# Patient Record
Sex: Male | Born: 1965 | Race: Black or African American | Hispanic: No | Marital: Single | Smoking: Current every day smoker
Health system: Southern US, Community
[De-identification: ages and names within clinical notes are randomized; demographics above are authoritative.]

---

## 2007-07-03 ENCOUNTER — Emergency Department (HOSPITAL_COMMUNITY): Admission: EM | Admit: 2007-07-03 | Discharge: 2007-07-03 | Payer: Self-pay | Admitting: Emergency Medicine

## 2011-03-22 ENCOUNTER — Emergency Department (HOSPITAL_COMMUNITY)
Admission: EM | Admit: 2011-03-22 | Discharge: 2011-03-24 | Disposition: A | Discharge status: Other Acute Inpatient Hospital | Payer: Self-pay | Attending: Emergency Medicine | Admitting: Emergency Medicine

## 2011-03-22 DIAGNOSIS — F17200 Nicotine dependence, unspecified, uncomplicated: Secondary | ICD-10-CM | POA: Insufficient documentation

## 2011-03-22 DIAGNOSIS — H5316 Psychophysical visual disturbances: Secondary | ICD-10-CM | POA: Insufficient documentation

## 2011-03-22 DIAGNOSIS — F1910 Other psychoactive substance abuse, uncomplicated: Secondary | ICD-10-CM

## 2011-03-22 DIAGNOSIS — F329 Major depressive disorder, single episode, unspecified: Secondary | ICD-10-CM

## 2011-03-22 DIAGNOSIS — F3289 Other specified depressive episodes: Secondary | ICD-10-CM | POA: Insufficient documentation

## 2011-03-22 DIAGNOSIS — F1410 Cocaine abuse, uncomplicated: Secondary | ICD-10-CM | POA: Insufficient documentation

## 2011-03-22 DIAGNOSIS — R45850 Homicidal ideations: Secondary | ICD-10-CM | POA: Insufficient documentation

## 2011-03-22 DIAGNOSIS — R443 Hallucinations, unspecified: Secondary | ICD-10-CM

## 2011-03-22 DIAGNOSIS — IMO0002 Reserved for concepts with insufficient information to code with codable children: Secondary | ICD-10-CM | POA: Insufficient documentation

## 2011-03-22 DIAGNOSIS — F22 Delusional disorders: Secondary | ICD-10-CM | POA: Insufficient documentation

## 2011-03-22 LAB — CBC
HCT: 40.1 % (ref 39.0–52.0)
Hemoglobin: 13.8 g/dL (ref 13.0–17.0)
MCH: 31.4 pg (ref 26.0–34.0)
MCV: 91.3 fL (ref 78.0–100.0)
Platelets: 305 10*3/uL (ref 150–400)
RBC: 4.39 MIL/uL (ref 4.22–5.81)
WBC: 7.1 10*3/uL (ref 4.0–10.5)

## 2011-03-22 LAB — RAPID URINE DRUG SCREEN, HOSP PERFORMED
Amphetamines: NOT DETECTED
Barbiturates: NOT DETECTED
Benzodiazepines: NOT DETECTED
Tetrahydrocannabinol: NOT DETECTED

## 2011-03-22 LAB — BASIC METABOLIC PANEL
BUN: 8 mg/dL (ref 6–23)
CO2: 23 mEq/L (ref 19–32)
Calcium: 9.5 mg/dL (ref 8.4–10.5)
Chloride: 103 mEq/L (ref 96–112)
Creatinine, Ser: 0.99 mg/dL (ref 0.50–1.35)
Glucose, Bld: 95 mg/dL (ref 70–99)

## 2011-03-22 MED ORDER — LORAZEPAM 1 MG PO TABS
1.0000 mg | ORAL_TABLET | Freq: Three times a day (TID) | ORAL | Status: DC | PRN
  Administered 2011-03-23: 1 mg via ORAL
  Filled 2011-03-22: qty 1

## 2011-03-22 MED ORDER — NICOTINE 21 MG/24HR TD PT24: 21.0000 mg | MEDICATED_PATCH | Freq: Every day | TRANSDERMAL | Status: DC

## 2011-03-22 MED ORDER — ONDANSETRON HCL 4 MG PO TABS: 4.0000 mg | ORAL_TABLET | Freq: Three times a day (TID) | ORAL | Status: DC | PRN

## 2011-03-22 MED ORDER — LORAZEPAM 2 MG/ML IJ SOLN
2.0000 mg | Freq: Once | INTRAMUSCULAR | Status: AC
  Administered 2011-03-22: 2 mg via INTRAMUSCULAR
  Filled 2011-03-22: qty 1

## 2011-03-22 MED ORDER — ACETAMINOPHEN 325 MG PO TABS: 650.0000 mg | ORAL_TABLET | ORAL | Status: DC | PRN

## 2011-03-22 MED ORDER — IBUPROFEN 400 MG PO TABS: 600.0000 mg | ORAL_TABLET | Freq: Three times a day (TID) | ORAL | Status: DC | PRN

## 2011-03-22 MED ORDER — ZOLPIDEM TARTRATE 5 MG PO TABS: 5.0000 mg | ORAL_TABLET | Freq: Every evening | ORAL | Status: DC | PRN

## 2011-03-22 NOTE — ED Notes (Signed)
wanded by security before changing clothing, and after, belongings bagged and locked up, security at bedside for safety.  Pt was in exam 3 but moved to exam 16, sitter at bedside
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
## 2011-03-22 NOTE — ED Notes (Signed)
Dr.linker to see pt, pt cont. To yell out "Im gonna kill somebody", "i just cant help it".  "fucking niggers im gonna fuck them up"

## 2011-03-22 NOTE — ED Notes (Signed)
Pt's daughter committed suicide august 2012, walked to er tonight --seeing bugs,  Hearing voices that are telling him to kill people, pt yelling and cursing at unknown items in room

## 2011-03-23 NOTE — ED Notes (Signed)
Resting with eyes closed with sitter at bedside.  Respirations even, non-labored. In no apparent distress.

## 2011-03-23 NOTE — ED Notes (Signed)
Pt reports feeling homicidal and feelings of paranoia-states, "I just feel lost; I just want to kill everybody, and I feel like people are behind me; I see things crawling on the wall".  Pt reports that this has been happening since his daughter died in 13-Nov-2010.  Pt reports drinking everyday "a lot", and states that he last used crack cocaine yesterday.  Reports that his use of alcohol and drugs is more frequent since his daughter died. Cooperative, anxious.
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
## 2011-03-23 NOTE — ED Notes (Signed)
Erik Burns, ACT member at bedside to evaluate.

## 2011-03-23 NOTE — ED Notes (Signed)
Awake. Requested something to drink and crackers - given.  Watching TV. No complaints.

## 2011-03-24 NOTE — ED Notes (Signed)
See downtime charting. 
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
## 2011-03-24 NOTE — ED Notes (Signed)
Pt ate breakfast and requested to use the phone.  Allowed pt to use the phone.  Sitter at bedside.

## 2011-03-24 NOTE — ED Notes (Signed)
Erik Burns with act team here to see pt.

## 2011-03-24 NOTE — ED Notes (Addendum)
[employer] called for transportation [place], [employer] has been called and awaiting for transport to facility

## 2011-03-24 NOTE — ED Notes (Signed)
[employer] here for pt transportation

## 2011-04-08 ENCOUNTER — Emergency Department (HOSPITAL_COMMUNITY)
Admission: EM | Admit: 2011-04-08 | Discharge: 2011-04-08 | Disposition: A | Discharge status: Home / Self Care | Payer: Self-pay | Attending: Emergency Medicine | Admitting: Emergency Medicine

## 2011-04-08 ENCOUNTER — Encounter (HOSPITAL_COMMUNITY): Payer: Self-pay | Admitting: Emergency Medicine

## 2011-04-08 DIAGNOSIS — Z79899 Other long term (current) drug therapy: Secondary | ICD-10-CM | POA: Insufficient documentation

## 2011-04-08 DIAGNOSIS — F3289 Other specified depressive episodes: Secondary | ICD-10-CM | POA: Insufficient documentation

## 2011-04-08 DIAGNOSIS — F1910 Other psychoactive substance abuse, uncomplicated: Secondary | ICD-10-CM | POA: Insufficient documentation

## 2011-04-08 DIAGNOSIS — F17200 Nicotine dependence, unspecified, uncomplicated: Secondary | ICD-10-CM | POA: Insufficient documentation

## 2011-04-08 DIAGNOSIS — F329 Major depressive disorder, single episode, unspecified: Secondary | ICD-10-CM | POA: Insufficient documentation

## 2011-04-08 NOTE — ED Notes (Signed)
ACT team with patient 

## 2011-04-08 NOTE — ED Notes (Signed)
MD at bedside. 
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
## 2011-04-08 NOTE — ED Notes (Addendum)
This clinician talked briefly with Erik Burns.  He has been given referral information on rehab beds.  This clinician did call both [facility] in [place] and [facility] and both places said that a patient would need to call during business hours to apply for a rehabilitation bed.  Denies any SI, HI or A/V hallucinations.  Mr. Tallerico said that he had no way to get home in [place].  Dr. Loretha Stapler was informed and he will place a call to SW.
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
## 2011-04-09 NOTE — ED Provider Notes (Signed)
I saw and evaluated the patient, reviewed the resident's note and I agree with the findings and plan. Pt is well appearing and was dropped off here due to being kicked out of rehab.  He has not been abuse any substances and denies needing anything except a ride back home.  Gwyneth Sprout, MD 04/09/11 1124

## 2011-07-31 ENCOUNTER — Encounter (HOSPITAL_COMMUNITY): Payer: Self-pay | Admitting: *Deleted

## 2011-07-31 ENCOUNTER — Emergency Department (HOSPITAL_COMMUNITY): Payer: No Typology Code available for payment source

## 2011-07-31 ENCOUNTER — Emergency Department (HOSPITAL_COMMUNITY)
Admission: EM | Admit: 2011-07-31 | Discharge: 2011-07-31 | Disposition: A | Discharge status: Home / Self Care | Payer: No Typology Code available for payment source | Attending: Emergency Medicine | Admitting: Emergency Medicine

## 2011-07-31 DIAGNOSIS — M25559 Pain in unspecified hip: Secondary | ICD-10-CM | POA: Insufficient documentation

## 2011-07-31 DIAGNOSIS — S8000XA Contusion of unspecified knee, initial encounter: Secondary | ICD-10-CM | POA: Insufficient documentation

## 2011-07-31 DIAGNOSIS — S7000XA Contusion of unspecified hip, initial encounter: Secondary | ICD-10-CM | POA: Insufficient documentation

## 2011-07-31 DIAGNOSIS — M25569 Pain in unspecified knee: Secondary | ICD-10-CM | POA: Insufficient documentation

## 2011-07-31 DIAGNOSIS — T07XXXA Unspecified multiple injuries, initial encounter: Secondary | ICD-10-CM

## 2011-07-31 DIAGNOSIS — Y92410 Unspecified street and highway as the place of occurrence of the external cause: Secondary | ICD-10-CM | POA: Insufficient documentation

## 2011-07-31 MED ORDER — IBUPROFEN 800 MG PO TABS: 800.0000 mg | ORAL_TABLET | Freq: Three times a day (TID) | ORAL | Status: AC

## 2011-07-31 MED ORDER — IBUPROFEN 800 MG PO TABS
800.0000 mg | ORAL_TABLET | Freq: Once | ORAL | Status: AC
  Administered 2011-07-31: 800 mg via ORAL
  Filled 2011-07-31: qty 1
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
## 2011-07-31 NOTE — Discharge Instructions (Signed)
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
## 2011-07-31 NOTE — ED Notes (Signed)
Abrasion to left knee noted

## 2011-08-04 ENCOUNTER — Encounter (HOSPITAL_COMMUNITY): Payer: Self-pay | Admitting: *Deleted

## 2011-08-04 ENCOUNTER — Emergency Department (HOSPITAL_COMMUNITY)
Admission: EM | Admit: 2011-08-04 | Discharge: 2011-08-04 | Disposition: A | Discharge status: Home / Self Care | Payer: No Typology Code available for payment source | Attending: Emergency Medicine | Admitting: Emergency Medicine

## 2011-08-04 DIAGNOSIS — M25559 Pain in unspecified hip: Secondary | ICD-10-CM

## 2011-08-04 NOTE — Discharge Instructions (Signed)
Please soak hip in warm salt water 2 times daily. Alternate 1000mg  of tylenol and 800mg  of ibuprofen every 6 hours for soreness. Please set up appointment with Dr Hilda Lias for orthopedic opinion of your continued hip pain.Arthralgia Arthralgia is joint pain. A joint is a place where two bones meet. Joint pain can happen for many reasons. The joint can be bruised, stiff, infected, or weak from aging. Pain usually goes away after resting and taking medicine for soreness.  HOME CARE  Rest the joint as told by your doctor.   Keep the sore joint raised (elevated) for the first 24 hours.   Put ice on the joint area.   Put ice in a plastic bag.   Place a towel between your skin and the bag.   Leave the ice on for 15 to 20 minutes, 3 to 4 times a day.   Wear your splint, casting, elastic bandage, or sling as told by your doctor.   Only take medicine as told by your doctor. Do not take aspirin.   Use crutches as told by your doctor. Do not put weight on the joint until told to by your doctor.  GET HELP RIGHT AWAY IF:   You have bruising, puffiness (swelling), or more pain.   Your fingers or toes turn blue or start to lose feeling (numb).   Your medicine does not lessen the pain.   Your pain becomes severe.   You have a temperature by mouth above 102 F (38.9 C), not controlled by medicine.   You cannot move or use the joint.  MAKE SURE YOU:   Understand these instructions.   Will watch your condition.   Will get help right away if you are not doing well or get worse.  Document Released: 02/17/2009 Document Revised: 02/18/2011 Document Reviewed: 02/17/2009 [facility] Patient Information 2012 [place], [place].

## 2011-08-04 NOTE — ED Provider Notes (Signed)
History     CSN: [id number]  Arrival date & time 08/04/11  1610   First MD Initiated Contact with Patient 08/04/11 1635      Chief Complaint  Patient presents with  . Hip Pain    (Consider location/radiation/quality/duration/timing/severity/associated sxs/prior treatment) Patient is a 46 y.o. male presenting with hip pain.  Hip Pain This is a new problem. The current episode started in the past 7 days. The problem occurs daily. The problem has been gradually worsening. Associated symptoms include arthralgias. Pertinent negatives include no abdominal pain, change in bowel habit, chest pain, coughing, joint swelling, nausea or neck pain. The symptoms are aggravated by bending, standing and walking. He has tried NSAIDs for the symptoms.    History reviewed. No pertinent past medical history.  No past surgical history on file.  No family history on file.  History  Substance Use Topics  . Smoking status: Current Everyday Smoker    Types: Cigarettes  . Smokeless tobacco: Not on file  . Alcohol Use: Yes     everyday      Review of Systems  Constitutional: Negative for activity change.       All ROS Neg except as noted in HPI  HENT: Negative for nosebleeds and neck pain.   Eyes: Negative for photophobia and discharge.  Respiratory: Negative for cough, shortness of breath and wheezing.   Cardiovascular: Negative for chest pain and palpitations.  Gastrointestinal: Negative for nausea, abdominal pain, blood in stool and change in bowel habit.  Genitourinary: Negative for dysuria, frequency and hematuria.  Musculoskeletal: Positive for arthralgias. Negative for back pain and joint swelling.  Skin: Negative.   Neurological: Negative for dizziness, seizures and speech difficulty.  Psychiatric/Behavioral: Negative for hallucinations and confusion.    Allergies  Review of patient's allergies indicates no known allergies.  Home Medications   Current Outpatient Rx  Name Route  Sig Dispense Refill  . IBUPROFEN 800 MG PO TABS Oral Take 1 tablet (800 mg total) by mouth 3 (three) times daily. 21 tablet 0  . NAPROXEN SODIUM 220 MG PO CAPS Oral Take 440 mg by mouth daily as needed. For pain (hip)      BP 117/70  Pulse 97  Temp(Src) 98 F (36.7 C) (Oral)  Resp 20  Ht 5' 6.5" (1.689 m)  Wt 130 lb (58.968 kg)  BMI 20.67 kg/m2  SpO2 100%  Physical Exam  Nursing note and vitals reviewed. Constitutional: He is oriented to person, place, and time. He appears well-developed and well-nourished.  Non-toxic appearance.  HENT:  Head: Normocephalic.  Right Ear: Tympanic membrane and external ear normal.  Left Ear: Tympanic membrane and external ear normal.  Eyes: EOM and lids are normal. Pupils are equal, round, and reactive to light.  Neck: Normal range of motion. Neck supple. Carotid bruit is not present.  Cardiovascular: Normal rate, regular rhythm, normal heart sounds, intact distal pulses and normal pulses.   Pulmonary/Chest: Breath sounds normal. No respiratory distress.  Abdominal: Soft. Bowel sounds are normal. There is no tenderness. There is no guarding.  Musculoskeletal: Normal range of motion.       Soreness with ROM of the left hip. No hot joint. No effusion noted. No deformity. Pt ambulatory, but with soreness.  Lymphadenopathy:       Head (right side): No submandibular adenopathy present.       Head (left side): No submandibular adenopathy present.    He has no cervical adenopathy.  Neurological: He is alert and oriented  to person, place, and time. He has normal strength. No cranial nerve deficit or sensory deficit.  Skin: Skin is warm and dry.  Psychiatric: He has a normal mood and affect. His speech is normal.    ED Course  Procedures (including critical care time)  Labs Reviewed - No data to display No results found.   No diagnosis found.    MDM  I have reviewed nursing notes, vital signs, and all appropriate lab and imaging results for  this patient. No gross neuro changes . No deformity. Pt willl see PCP for follow up and recheck.       Kathie Dike, [place] 08/11/11 2232
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
## 2011-08-12 NOTE — ED Provider Notes (Signed)
Medical screening examination/treatment/procedure(s) were performed by non-physician practitioner and as supervising physician I was immediately available for consultation/collaboration.   Glynn Octave, MD 08/12/11 [phone]

## 2011-09-26 ENCOUNTER — Emergency Department (HOSPITAL_COMMUNITY): Payer: Self-pay

## 2011-09-26 ENCOUNTER — Encounter (HOSPITAL_COMMUNITY): Payer: Self-pay

## 2011-09-26 ENCOUNTER — Emergency Department (HOSPITAL_COMMUNITY)
Admission: EM | Admit: 2011-09-26 | Discharge: 2011-09-26 | Disposition: A | Discharge status: Home / Self Care | Payer: Self-pay | Attending: Emergency Medicine | Admitting: Emergency Medicine

## 2011-09-26 DIAGNOSIS — M542 Cervicalgia: Secondary | ICD-10-CM | POA: Insufficient documentation

## 2011-09-26 DIAGNOSIS — S0990XA Unspecified injury of head, initial encounter: Secondary | ICD-10-CM | POA: Insufficient documentation

## 2011-09-26 DIAGNOSIS — Y9289 Other specified places as the place of occurrence of the external cause: Secondary | ICD-10-CM | POA: Insufficient documentation

## 2011-09-26 DIAGNOSIS — F17200 Nicotine dependence, unspecified, uncomplicated: Secondary | ICD-10-CM | POA: Insufficient documentation

## 2011-09-26 DIAGNOSIS — W19XXXA Unspecified fall, initial encounter: Secondary | ICD-10-CM | POA: Insufficient documentation

## 2011-09-26 MED ORDER — HYDROCODONE-ACETAMINOPHEN 5-325 MG PO TABS: 1.0000 | ORAL_TABLET | Freq: Four times a day (QID) | ORAL | Status: AC | PRN

## 2011-09-26 MED ORDER — HYDROCODONE-ACETAMINOPHEN 5-325 MG PO TABS
1.0000 | ORAL_TABLET | Freq: Once | ORAL | Status: AC
  Administered 2011-09-26: 1 via ORAL
  Filled 2011-09-26: qty 1
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
## 2011-10-17 ENCOUNTER — Encounter (HOSPITAL_COMMUNITY): Payer: Self-pay | Admitting: Emergency Medicine

## 2011-10-17 ENCOUNTER — Emergency Department (HOSPITAL_COMMUNITY): Payer: Self-pay

## 2011-10-17 ENCOUNTER — Emergency Department (HOSPITAL_COMMUNITY)
Admission: EM | Admit: 2011-10-17 | Discharge: 2011-10-17 | Disposition: A | Discharge status: Home / Self Care | Payer: Self-pay | Attending: Emergency Medicine | Admitting: Emergency Medicine

## 2011-10-17 DIAGNOSIS — S5292XA Unspecified fracture of left forearm, initial encounter for closed fracture: Secondary | ICD-10-CM

## 2011-10-17 DIAGNOSIS — S52509A Unspecified fracture of the lower end of unspecified radius, initial encounter for closed fracture: Secondary | ICD-10-CM | POA: Insufficient documentation

## 2011-10-17 DIAGNOSIS — W03XXXA Other fall on same level due to collision with another person, initial encounter: Secondary | ICD-10-CM | POA: Insufficient documentation

## 2011-10-17 DIAGNOSIS — S52613A Displaced fracture of unspecified ulna styloid process, initial encounter for closed fracture: Secondary | ICD-10-CM

## 2011-10-17 MED ORDER — HYDROCODONE-ACETAMINOPHEN 5-325 MG PO TABS
1.0000 | ORAL_TABLET | Freq: Once | ORAL | Status: AC
  Administered 2011-10-17: 1 via ORAL
  Filled 2011-10-17: qty 1

## 2011-10-17 MED ORDER — HYDROCODONE-ACETAMINOPHEN 5-325 MG PO TABS: 1.0000 | ORAL_TABLET | ORAL | Status: AC | PRN

## 2011-10-17 NOTE — ED Provider Notes (Signed)
Medical screening examination/treatment/procedure(s) were performed by non-physician practitioner and as supervising physician I was immediately available for consultation/collaboration.   Lyanne Co, MD 10/17/11 [phone]
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
## 2011-10-17 NOTE — ED Notes (Signed)
Pt has swelling to his left wrist x 2 days. Pt able to wiggle fingers but causes pain. Strong radial pulse palpated. Alert and oriented x 3. Skin warm and dry. Color pink.

## 2013-02-26 ENCOUNTER — Emergency Department (HOSPITAL_COMMUNITY)
Admission: EM | Admit: 2013-02-26 | Discharge: 2013-02-26 | Discharge status: Against Medical Advice | Payer: Self-pay | Attending: Emergency Medicine | Admitting: Emergency Medicine

## 2013-02-26 ENCOUNTER — Encounter (HOSPITAL_COMMUNITY): Payer: Self-pay | Admitting: Emergency Medicine

## 2013-02-26 DIAGNOSIS — Z046 Encounter for general psychiatric examination, requested by authority: Secondary | ICD-10-CM | POA: Insufficient documentation

## 2013-02-26 DIAGNOSIS — F17200 Nicotine dependence, unspecified, uncomplicated: Secondary | ICD-10-CM | POA: Insufficient documentation

## 2013-02-26 LAB — BASIC METABOLIC PANEL
BUN: 10 mg/dL (ref 6–23)
Calcium: 9.8 mg/dL (ref 8.4–10.5)
GFR calc non Af Amer: 80 mL/min — ABNORMAL LOW (ref >90)
Glucose, Bld: 93 mg/dL (ref 70–99)
Sodium: 138 mEq/L (ref 135–145)

## 2013-02-26 LAB — CBC
HCT: 50.9 % (ref 39.0–52.0)
Hemoglobin: 17.3 g/dL — ABNORMAL HIGH (ref 13.0–17.0)
MCH: 33.2 pg (ref 26.0–34.0)
MCHC: 34 g/dL (ref 30.0–36.0)

## 2013-02-26 NOTE — ED Notes (Signed)
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

## 2013-02-26 NOTE — ED Notes (Signed)
Denies hallucinations.

## 2013-02-27 ENCOUNTER — Emergency Department (HOSPITAL_COMMUNITY)
Admission: EM | Admit: 2013-02-27 | Discharge: 2013-02-27 | Disposition: A | Discharge status: Home / Self Care | Payer: Self-pay | Attending: Emergency Medicine | Admitting: Emergency Medicine

## 2013-02-27 ENCOUNTER — Encounter (HOSPITAL_COMMUNITY): Payer: Self-pay | Admitting: Emergency Medicine

## 2013-02-27 DIAGNOSIS — F1420 Cocaine dependence, uncomplicated: Secondary | ICD-10-CM | POA: Insufficient documentation

## 2013-02-27 DIAGNOSIS — F17200 Nicotine dependence, unspecified, uncomplicated: Secondary | ICD-10-CM | POA: Insufficient documentation

## 2013-02-27 DIAGNOSIS — F1020 Alcohol dependence, uncomplicated: Secondary | ICD-10-CM | POA: Insufficient documentation

## 2013-02-27 DIAGNOSIS — F1910 Other psychoactive substance abuse, uncomplicated: Secondary | ICD-10-CM

## 2013-02-27 DIAGNOSIS — F1220 Cannabis dependence, uncomplicated: Secondary | ICD-10-CM | POA: Insufficient documentation

## 2013-02-27 LAB — CBC
HCT: 45.8 % (ref 39.0–52.0)
MCHC: 34.9 g/dL (ref 30.0–36.0)
Platelets: 274 10*3/uL (ref 150–400)
RBC: 4.74 MIL/uL (ref 4.22–5.81)
RDW: 14.6 % (ref 11.5–15.5)
WBC: 4.8 10*3/uL (ref 4.0–10.5)

## 2013-02-27 LAB — ETHANOL: Alcohol, Ethyl (B): 109 mg/dL — ABNORMAL HIGH (ref 0–11)

## 2013-02-27 LAB — HEPATIC FUNCTION PANEL
Albumin: 3.6 g/dL (ref 3.5–5.2)
Total Protein: 7.4 g/dL (ref 6.0–8.3)

## 2013-02-27 LAB — SALICYLATE LEVEL: Salicylate Lvl: <2 mg/dL — ABNORMAL LOW (ref 2.8–20.0)

## 2013-02-27 LAB — URINALYSIS, ROUTINE W REFLEX MICROSCOPIC
Glucose, UA: NEGATIVE mg/dL
Hgb urine dipstick: NEGATIVE
Ketones, ur: NEGATIVE mg/dL
Protein, ur: NEGATIVE mg/dL

## 2013-02-27 LAB — ACETAMINOPHEN LEVEL: Acetaminophen (Tylenol), Serum: <15 ug/mL (ref 10–30)

## 2013-02-27 LAB — BASIC METABOLIC PANEL
Chloride: 104 mEq/L (ref 96–112)
GFR calc Af Amer: >90 mL/min (ref >90)
Potassium: 4 mEq/L (ref 3.5–5.1)
Sodium: 139 mEq/L (ref 135–145)

## 2013-02-27 LAB — RAPID URINE DRUG SCREEN, HOSP PERFORMED
Amphetamines: NOT DETECTED
Tetrahydrocannabinol: POSITIVE — AB

## 2013-02-27 MED ORDER — ACETAMINOPHEN 500 MG PO TABS: 1000.0000 mg | ORAL_TABLET | Freq: Once | ORAL | Status: DC

## 2013-02-27 NOTE — ED Notes (Signed)
Pt has been very rude with staff, states he was here for 5 hours yesterday for detox and was told to come back in the morning when transport would be available to take him to a facility for detox. This nurse was at the nursing station when the charge nurse was explaining to him that he would be here for the night and the patient decided to laeve without being seen. Pt has been placed in scrubs, belongings collected. Pt states he did drink last night and did use drugs.

## 2013-02-27 NOTE — ED Provider Notes (Signed)
CSN: [id number]     Arrival date & time 02/27/13  4098 History  This chart was scribed for Donnetta Hutching, MD,  by Ashley Jacobs, ED Scribe. The patient was seen in room APA10/APA10 and the patient's care was started at 7:32 AM.  First MD Initiated Contact with Patient 02/27/13 0710     Chief Complaint  Patient presents with  . Medical Clearance  . Addiction Problem   (Consider location/radiation/quality/duration/timing/severity/associated sxs/prior Treatment) The history is provided by the patient and medical records. No language interpreter was used.   HPI Comments: Erik Burns is a 47 y.o. male who presents to the Emergency Department for medical clearance and alcohol addiction problems.  He came to the ED yesterday for the same complaint but he left because he couldn't get a ride to [place]. He admits to drinking 120 oz and smoke marijuana last night. Pt states he voluntary came to the ED under the suggestions of his probation officer so that he can undergo a detox treatment. He currently smokes marijuana, cocaine and alcohol. He denies SI and HI.  History reviewed. No pertinent past medical history. History reviewed. No pertinent past surgical history. No family history on file. History  Substance Use Topics  . Smoking status: Current Every Day Smoker    Types: Cigarettes  . Smokeless tobacco: Not on file  . Alcohol Use: Yes     Comment: everyday    Review of Systems  Psychiatric/Behavioral: Negative for suicidal ideas.  All other systems reviewed and are negative.    Allergies  Ibuprofen  Home Medications  No current outpatient prescriptions on file. BP 103/71  Pulse 74  Temp(Src) 98.3 F (36.8 C)  Resp 18  Ht 5' 6.5" (1.689 m)  Wt 135 lb (61.236 kg)  BMI 21.47 kg/m2  SpO2 96% Physical Exam  Nursing note and vitals reviewed. Constitutional: He is oriented to person, place, and time. He appears well-developed and well-nourished.  HENT:  Head: Normocephalic  and atraumatic.  Eyes: Conjunctivae and EOM are normal. Pupils are equal, round, and reactive to light.  Neck: Normal range of motion. Neck supple.  Cardiovascular: Normal rate, regular rhythm and normal heart sounds.   Pulmonary/Chest: Effort normal and breath sounds normal.  Abdominal: Soft. Bowel sounds are normal.  Musculoskeletal: Normal range of motion.  Neurological: He is alert and oriented to person, place, and time.  Skin: Skin is warm and dry.    ED Course  Procedures (including critical care time) DIAGNOSTIC STUDIES: Oxygen Saturation is 96% on room air, normal by my interpretation.    COORDINATION OF CARE: 7:36 AM Discussed course of care with pt which includes laboratory tests and urinalysis . Pt understands and agrees.  Labs Review Labs Reviewed  URINALYSIS, ROUTINE W REFLEX MICROSCOPIC - Abnormal; Notable for the following:    Specific Gravity, Urine >1.030 (*)    All other components within normal limits  URINE RAPID DRUG SCREEN (HOSP PERFORMED) - Abnormal; Notable for the following:    Cocaine POSITIVE (*)    Tetrahydrocannabinol POSITIVE (*)    All other components within normal limits  ETHANOL - Abnormal; Notable for the following:    Alcohol, Ethyl (B) 109 (*)    All other components within normal limits  BASIC METABOLIC PANEL - Abnormal; Notable for the following:    GFR calc non Af Amer 86 (*)    All other components within normal limits  SALICYLATE LEVEL - Abnormal; Notable for the following:    Salicylate Lvl <  2.0 (*)    All other components within normal limits  HEPATIC FUNCTION PANEL - Abnormal; Notable for the following:    Total Bilirubin 0.2 (*)    Indirect Bilirubin 0.1 (*)    All other components within normal limits  CBC  ACETAMINOPHEN LEVEL   Imaging Review No results found.  EKG Interpretation   None       MDM  No diagnosis found. Patient has a polysubstance abuse problem. He is not psychotic. No suicidal or homicidal ideation.  Behavioral health consult pending   I personally performed the services described in this documentation, which was scribed in my presence. The recorded information has been reviewed and is accurate.    Donnetta Hutching, MD 02/27/13 1332

## 2013-02-27 NOTE — BH Assessment (Signed)
Tele Assessment Note   Erik Burns is an 47 y.o. male. Writer spoke w/ EDP Erik Burns to discuss pt. Pt is at [facility] and assessed via teleassessment machine. Pt is irritable. He states that his probation officers Erik Burns suggested he come to ED in order to get detox. Pt says he has been screened at [facility] in [place] and he has another interview w/ them. 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 Pt sts his 46 yo daughter committed suicide in 2012. Pt's BAL was 100 upon admission. He sts he drinks approx. 12 12-oz beers daily. He drank three 40-oz beers last night 02/26/13. Pt denies withdrawal symptoms and sts he never has withdrawal symptoms. Pt reports he wasn't in the [employer] and has never had a seizure when stopping drinking. Pt sts he smokes crack cocaine approx. once a month and smokes THC approx. once a month. Pt sts he has been to prison and six mos is longest length of sobriety while in prison. Pt says he has no upcoming court dates. He says, "I am tired of alcohol. I'm sick of it". Pt endorses euthymic mood. He reports no hx of detox or treatment for substance abuse. Pt denies SI and HI. He denies [facility] and no delusions noted. Pt sts he is unclear why he needs to be at [facility].  Pt sts last smoked thc on 02/26/13 and last smoked crack 02/25/13. Pt meets criteria for inpatient detox program if he so chooses and meets criteria for outpatient treatment if he is interested. Spoke w/ EDP Erik Burns after teleassessment and informed Erik Burns that TTS will attempt to get him into detox for alcohol. As patient is voluntary, if he chooses to leave pt may do so.  Writer attempted to call [name] in [place].   Axis I: Alcohol Use Disorder, Severe            Cocaine Use Disorder, Mild            Cannabis Use Disorder, Mild Axis II: Deferred Axis III: History reviewed. No pertinent past medical history. Axis IV: economic problems, other psychosocial or environmental problems, problems related to social  environment and problems with primary support group Axis V: 41-50 serious symptoms  Past Medical History: History reviewed. No pertinent past medical history.  History reviewed. No pertinent past surgical history.  Family History: No family history on file.  Social History:  reports that he has been smoking Cigarettes.  He has been smoking about 0.00 packs per day. He does not have any smokeless tobacco history on file. He reports that he drinks alcohol. He reports that he uses illicit drugs (Cocaine, Marijuana, and Methamphetamines).  Additional Social History:  Alcohol / Drug Use Pain Medications: see PTA meds list Prescriptions: see PTA meds list Over the Counter: see PTA meds list History of alcohol / drug use?: Yes Longest period of sobriety (when/how long): 6 mos Substance #1 Name of Substance 1: alcohol 1 - Age of First Use: 7 1 - Amount (size/oz): twelve 12-oz beers 1 - Frequency: daily 1 - Duration: year 1 - Last Use / Amount: 02/26/13 - three 40 oz beers Substance #2 Name of Substance 2: crack cocaine 2 - Age of First Use: 29 2 - Amount (size/oz): varies 2 - Frequency: once a month 2 - Duration: year 2 - Last Use / Amount: 02/26/13 Substance #3 Name of Substance 3: THC 3 - Age of First Use: 16 3 - Amount (size/oz): varies 3 - Frequency:  once a month 3 - Duration:  year 3 - Last Use / Amount: 02/25/13  CIWA: CIWA-Ar BP: 103/71 mmHg Pulse Rate: 74 COWS:    Allergies:  Allergies  Allergen Reactions  . Ibuprofen Other (See Comments)    REACTION: G.I. Upset    Home Medications:  (Not in a hospital admission)  OB/GYN Status:  No LMP for male patient.  General Assessment Data Location of Assessment: [facility] Is this a Tele or Face-to-Face Assessment?: Tele Assessment Is this an Initial Assessment or a Re-assessment for this encounter?: Initial Assessment Living Arrangements: Parent (dad) Can pt return to current living arrangement?: Yes Admission Status:  Voluntary Is patient capable of signing voluntary admission?: Yes Transfer from: Home Referral Source: Self/Family/Friend     [facility] Crisis Care Plan Living Arrangements: Parent (dad)  Education Status Is patient currently in school?: No Highest grade of school patient has completed: 12  Risk to self Suicidal Ideation: No Suicidal Intent: No Is patient at risk for suicide?: No Suicidal Plan?: No Access to Means: No What has been your use of drugs/alcohol within the last 12 months?: daily alcohol, monthly crack and thc Previous Attempts/Gestures: No How many times?: 0 Other Self Harm Risks: none Triggers for Past Attempts:  (n/a) Intentional Self Injurious Behavior: None Family Suicide History: Yes (daughter committed suicide at age 44 in 8) Recent stressful life event(s): Other (Comment);Legal Issues;Financial Problems (substance abuse) Persecutory voices/beliefs?: No Depression: No Substance abuse history and/or treatment for substance abuse?: Yes Suicide prevention information given to non-admitted patients: Not applicable  Risk to Others Homicidal Ideation: No Thoughts of Harm to Others: No Current Homicidal Intent: No Current Homicidal Plan: No Access to Homicidal Means: No Identified Victim: none History of harm to others?: No Assessment of Violence: None Noted Violent Behavior Description: pt denies hx of violence Does patient have access to weapons?: No Criminal Charges Pending?: No Does patient have a court date: No  Psychosis Hallucinations: None noted Delusions: None noted  Mental Status Report Appear/Hygiene: Disheveled Eye Contact: Fair Motor Activity: Freedom of movement;Restlessness Speech: Logical/coherent Level of Consciousness: Alert;Irritable Mood: Other (Comment) (euthymic) Affect: Irritable Anxiety Level: None Thought Processes: Coherent;Relevant Judgement: Unimpaired Orientation: Person;Place;Situation;Time Obsessive Compulsive  Thoughts/Behaviors: None  Cognitive Functioning Concentration: Normal Memory: Remote Intact;Recent Intact IQ: Average Insight: Fair Impulse Control: Poor Appetite: Fair Sleep: No Change Total Hours of Sleep: 5 Vegetative Symptoms: None  ADLScreening [facility]) Patient's cognitive ability adequate to safely complete daily activities?: Yes Patient able to express need for assistance with ADLs?: Yes Independently performs ADLs?: Yes (appropriate for developmental age)  Prior Inpatient Therapy Prior Inpatient Therapy: No Prior Therapy Dates: na Prior Therapy Facilty/Provider(s): na Reason for Treatment: na  Prior Outpatient Therapy Prior Outpatient Therapy: No Prior Therapy Dates: na Prior Therapy Facilty/Provider(s): na Reason for Treatment: na  ADL Screening (condition at time of admission) Patient's cognitive ability adequate to safely complete daily activities?: Yes Is the patient deaf or have difficulty hearing?: No Does the patient have difficulty seeing, even when wearing glasses/contacts?: No Does the patient have difficulty concentrating, remembering, or making decisions?: No Patient able to express need for assistance with ADLs?: Yes Does the patient have difficulty dressing or bathing?: No Independently performs ADLs?: Yes (appropriate for developmental age) Does the patient have difficulty walking or climbing stairs?: No Weakness of Legs: None Weakness of Arms/Hands: None  Home Assistive Devices/Equipment Home Assistive Devices/Equipment: None    Abuse/Neglect Assessment (Assessment to be complete while patient is alone) Physical Abuse:  Denies Verbal Abuse: Denies Sexual Abuse: Denies Exploitation of patient/patient's resources: Denies Self-Neglect: Denies Values / Beliefs Cultural Requests During Hospitalization: None Spiritual Requests During Hospitalization: None   Advance Directives (For Healthcare) Advance Directive: Patient does  not have advance directive;Patient would not like information    Additional Information 1:1 In Past 12 Months?: No CIRT Risk: No Elopement Risk: No Does patient have medical clearance?: No     Disposition:  Disposition Initial Assessment Completed for this Encounter: Yes Disposition of Patient: Inpatient treatment program;Outpatient treatment (inpatient detox or outpatient treatment) Type of inpatient treatment program: Adult Type of outpatient treatment: [facility] - Intensive Outpatient  Gumecindo Hopkin P 02/27/2013 11:30 AM

## 2013-02-27 NOTE — Progress Notes (Signed)
Per Elijah Birk, [facility] Assessment, patients' information will be reviewed by [facility]. Oncoming [facility] will follow-up.

## 2013-02-27 NOTE — ED Notes (Signed)
Pt belongings locked up  

## 2013-02-27 NOTE — Progress Notes (Signed)
Writer spoke with Joni Reining of [facility], she requests the results of the liver enzymes for the patients' referral packet. The writer informed the patients' attending nurse Brandy,RN to notify the MD that these results are needed for the patients' referral packet to [facility].

## 2013-02-27 NOTE — Progress Notes (Addendum)
Per request of Jason Coop, [facility] Assessment, the writer sent a detox referral to [facility]. The writer called [facility] and the admissions RN was unavailable.So a message was left on the voicemail for Melissa in admissions. The writer will follow-up at a later time to send detox referral to [facility] and other facilities if needed for placement.

## 2013-02-27 NOTE — Progress Notes (Addendum)
Per Efraim Kaufmann of [facility] the patient has been declined due to felony charge. [id number] 02-27-13 Writer is waiting to receive authorization number from [employer]. Patient has been accepted pending the authorization number. Writer left a message with Demetrios Isaacs at [phone] to receive authorization number.

## 2013-02-27 NOTE — Progress Notes (Signed)
[facility] referral sent to [facility] for placement review.

## 2013-02-27 NOTE — ED Notes (Signed)
Pt here for detox from etoh (last use last night-pt states he had about 120 oz), majiuana (last use last night), and cocane (last use 12/14). Denies si/hi.

## 2013-02-27 NOTE — ED Provider Notes (Signed)
10:11 PM I discussed the patient's presentation with our behavioral health team. The patient is a stable for discharge.  He'll be provided additional resources for substance abuse cessation.  VS remain stable.  Per RN, no new complaints.  Gerhard Munch, MD 02/27/13 2211

## 2013-02-27 NOTE — ED Notes (Signed)
Pt demanding paper work he has been signed by EDP so he can go to ? [facility] and stay for two years. Pt has been evaluated by EDP and TTS. Referral faxed to [facility]. Pt made aware. States he does not want to go there. We are not doing our job and all we are doing is charging him money.

## 2013-02-27 NOTE — BH Assessment (Signed)
[facility] Assessment Progress Note  After consulting with Donell Sievert, PA it has been determined that pt does not meet criteria for inpatient detox.  Pt has also been declined for admission to [facility] and [facility] for the same reason.  It is recommended that pt be referred to [facility] [phone]) for outpatient treatment services, and that he continue to pursue admission to the [facility] program [phone]) on his own initiative.  At 20:35 I spoke to EDP Dr Jeraldine Loots, who concurs with this opinion.  Doylene Canning, MA Triage Specialist 02/27/2013 @ 20:41

## 2013-02-27 NOTE — Progress Notes (Signed)
Per Arline Asp of [facility] declined due to inability to receive authorization number for treatment. Pt has not present any withdrawal symptoms therefore, Erik Burns of [facility] can not release an authorization number. [facility] will send more referrals to other facilities for placement.

## 2013-03-01 ENCOUNTER — Emergency Department (HOSPITAL_COMMUNITY)
Admission: EM | Admit: 2013-03-01 | Discharge: 2013-03-01 | Disposition: A | Discharge status: Home / Self Care | Payer: No Typology Code available for payment source | Attending: Emergency Medicine | Admitting: Emergency Medicine

## 2013-03-01 ENCOUNTER — Encounter (HOSPITAL_COMMUNITY): Payer: Self-pay | Admitting: Emergency Medicine

## 2013-03-01 DIAGNOSIS — F1210 Cannabis abuse, uncomplicated: Secondary | ICD-10-CM | POA: Insufficient documentation

## 2013-03-01 DIAGNOSIS — F1910 Other psychoactive substance abuse, uncomplicated: Secondary | ICD-10-CM

## 2013-03-01 DIAGNOSIS — F17200 Nicotine dependence, unspecified, uncomplicated: Secondary | ICD-10-CM | POA: Insufficient documentation

## 2013-03-01 DIAGNOSIS — F1010 Alcohol abuse, uncomplicated: Secondary | ICD-10-CM | POA: Insufficient documentation

## 2013-03-01 LAB — CBC WITH DIFFERENTIAL/PLATELET
Basophils Relative: 1 % (ref 0–1)
Eosinophils Relative: 15 % — ABNORMAL HIGH (ref 0–5)
HCT: 45.5 % (ref 39.0–52.0)
Hemoglobin: 15.6 g/dL (ref 13.0–17.0)
Lymphocytes Relative: 40 % (ref 12–46)
Lymphs Abs: 1.7 10*3/uL (ref 0.7–4.0)
MCV: 96.4 fL (ref 78.0–100.0)
Monocytes Absolute: 0.5 10*3/uL (ref 0.1–1.0)
Platelets: 249 10*3/uL (ref 150–400)
RBC: 4.72 MIL/uL (ref 4.22–5.81)
WBC: 4.2 10*3/uL (ref 4.0–10.5)

## 2013-03-01 LAB — COMPREHENSIVE METABOLIC PANEL
ALT: 11 U/L (ref 0–53)
AST: 19 U/L (ref 0–37)
CO2: 23 mEq/L (ref 19–32)
Calcium: 8.6 mg/dL (ref 8.4–10.5)
GFR calc Af Amer: >90 mL/min (ref >90)
GFR calc non Af Amer: >90 mL/min (ref >90)
Glucose, Bld: 84 mg/dL (ref 70–99)
Sodium: 138 mEq/L (ref 135–145)

## 2013-03-01 LAB — ETHANOL: Alcohol, Ethyl (B): <11 mg/dL (ref 0–11)

## 2013-03-01 LAB — RAPID URINE DRUG SCREEN, HOSP PERFORMED
Benzodiazepines: NOT DETECTED
Tetrahydrocannabinol: POSITIVE — AB

## 2013-03-01 MED ORDER — NICOTINE 21 MG/24HR TD PT24: 21.0000 mg | MEDICATED_PATCH | Freq: Every day | TRANSDERMAL | Status: DC

## 2013-03-01 MED ORDER — ACETAMINOPHEN 325 MG PO TABS: 650.0000 mg | ORAL_TABLET | ORAL | Status: DC | PRN

## 2013-03-01 MED ORDER — LORAZEPAM 1 MG PO TABS: 1.0000 mg | ORAL_TABLET | ORAL | Status: DC | PRN

## 2013-03-01 MED ORDER — ONDANSETRON HCL 4 MG PO TABS: 4.0000 mg | ORAL_TABLET | Freq: Three times a day (TID) | ORAL | Status: DC | PRN

## 2013-03-01 NOTE — ED Notes (Signed)
Pt spoke to his parole officer on the phone. Will come and pick the pt. up

## 2013-03-01 NOTE — ED Notes (Signed)
rn spoke to talia pt's drug counselor. [phone], stated she would send the pt's parole officer to pick him up for transport.

## 2013-03-01 NOTE — ED Notes (Signed)
TTS consult being performed at present. Pt had to be woken from sleep for consult.

## 2013-03-01 NOTE — ED Notes (Signed)
Pt asleep in bed, easily woken from sleep. Advised still needed urine, Pt still unable to give a urine specimen, given water and crackers.  Covered self back up with the sheet.

## 2013-03-01 NOTE — ED Notes (Signed)
Patient reports being seen here on Tuesday night for detox for alcohol, marijuana, and cocaine. Per patient was discharged and told to go o [facility]. Patient sent back to ER with prescreen papers to be signed before [facility] will accept patient. Denies any SI or HI. Per patient last had alcohol and marijuana last night, unsure about cocaine.

## 2013-03-01 NOTE — ED Provider Notes (Signed)
Medical screening examination/treatment/procedure(s) were performed by non-physician practitioner and as supervising physician I was immediately available for consultation/collaboration.  EKG Interpretation   None         Charles B. Bernette Mayers, MD 03/01/13 1143

## 2013-03-01 NOTE — BHH Counselor (Signed)
Spoke w/ pt's nurse [facility]. Writer gave Kennith Center update re: pt disposition and Kennith Center will contact pt's TASK counselor when ready to be d/c. Clayborn Bigness at [phone]. Writer will fax the pt info to [place] which was requested from [name].  Evette Cristal, [place] Assessment Counselor
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
## 2013-03-01 NOTE — BH Assessment (Signed)
Tele Assessment Note   Erik Burns is an 47 y.o. male. Erik Burns is an 47 y.o. male. Writer spoke w/ Erik Balzarine PA-C to discuss pt. Pt is at [facility] and assessed via teleassessment machine. Writer also assessed pt on 02/27/13. Pt is cooperative. Pt says he has been screened at [facility] in [place] and he has another interview w/ them. 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 Pt sts his 72 yo daughter committed suicide in 2012. Pt's BAL was 109 upon admission. He sts he drinks approx. 12 12-oz beers daily. He drank three 40-oz beers last night 02/28/13. Pt denies withdrawal symptoms and sts he never has withdrawal symptoms. Pt reports he wasn't in the [employer] and has never had a seizure when stopping drinking. Pt sts he smokes crack cocaine approx. once a month and smokes THC approx. once a month. Pt sts he has been to prison and six mos is longest length of sobriety while in prison. Pt says he has no upcoming court dates.  Pt endorses euthymic mood. He reports no hx of detox or treatment for substance abuse. Pt denies SI and HI. He denies [facility] and no delusions noted. Pt sts last smoked thc on 02/28/13 and last smoked crack 02/25/13. UDS not completed as time of teleassessment.  Writer called Clayborn Bigness, his TASK counselor. [phone]. Talyah sts she will transport pt upon d/c to [facility] in [place] for long-term SA treatement. Talyah sts pt is at [facility] for medical clearance only and that he doesn't need detox.  Writer spoke w/ Erik Burns after teleassessment. Tatiana in agreement w/ Clinical research associate that pt is safe to be d/c once he is medically cleared.     Axis I: Alcohol Use Disorder, Severe            Cocaine Use Disorder, Mild            Cannabis Use Disorder, Mild Axis II: Deferred Axis III: History reviewed. No pertinent past medical history. Axis IV: economic problems, other psychosocial or environmental problems, problems related to social environment and problems with primary support  group Axis V: 51-60 moderate symptoms  Past Medical History: History reviewed. No pertinent past medical history.  History reviewed. No pertinent past surgical history.  Family History: History reviewed. No pertinent family history.  Social History:  reports that he has been smoking Cigarettes.  He has a 28 pack-year smoking history. He has never used smokeless tobacco. He reports that he drinks about 7.2 ounces of alcohol per week. He reports that he uses illicit drugs (Cocaine, Marijuana, and Methamphetamines).  Additional Social History:  Alcohol / Drug Use Pain Medications: see PTA meds list Prescriptions: see PTA meds list Over the Counter: see PTA meds list History of alcohol / drug use?: Yes Longest period of sobriety (when/how long): 6 mos Negative Consequences of Use: Personal relationships;Legal;Financial Substance #1 Name of Substance 1: Alcohol 1 - Age of First Use: 7 1 - Amount (size/oz): twelve 12-oz beers 1 - Frequency: daily 1 - Duration: years 1 - Last Use / Amount: 02/28/13 - three 40-oz beers Substance #2 Name of Substance 2: crack cocaine 2 - Age of First Use: 29 2 - Amount (size/oz): varies  2 - Frequency: once monthly 2 - Duration: year 2 - Last Use / Amount: 02/26/13 Substance #3 Name of Substance 3: THC 3 - Age of First Use: 16 3 - Amount (size/oz): varies 3 - Frequency: oncea month 3 - Duration:  year  3 - Last Use / Amount: 02/28/13 - 1 joint  CIWA: CIWA-Ar BP: 112/83 mmHg Pulse Rate: 71 COWS:    Allergies:  Allergies  Allergen Reactions  . Ibuprofen Other (See Comments)    REACTION: G.I. Upset    Home Medications:  (Not in a hospital admission)  OB/GYN Status:  No LMP for male patient.  General Assessment Data Location of Assessment: [facility] Is this a Tele or Face-to-Face Assessment?: Tele Assessment Is this an Initial Assessment or a Re-assessment for this encounter?: Initial Assessment Living Arrangements: Parent (mom) Can pt  return to current living arrangement?: Yes Admission Status: Voluntary Is patient capable of signing voluntary admission?: Yes Transfer from: Home Referral Source: Self/Family/Friend     [facility] Crisis Care Plan Living Arrangements: Parent (mom)  Education Status Is patient currently in school?: No Highest grade of school patient has completed: 12  Risk to self Suicidal Ideation: No Suicidal Intent: No Is patient at risk for suicide?: No Suicidal Plan?: No Access to Means: No What has been your use of drugs/alcohol within the last 12 months?: daily alcohol, monthly crack and thc use Previous Attempts/Gestures: No How many times?: 0 Other Self Harm Risks: none Triggers for Past Attempts:  (n/a) Intentional Self Injurious Behavior: None Family Suicide History: Yes (daughter killed herself in 2012 at age 56) Recent stressful life event(s): Other (Comment) (substance abuse) Persecutory voices/beliefs?: No Depression: No Substance abuse history and/or treatment for substance abuse?: Yes Suicide prevention information given to non-admitted patients: Not applicable  Risk to Others Homicidal Ideation: No Thoughts of Harm to Others: No Current Homicidal Intent: No Current Homicidal Plan: No Access to Homicidal Means: No Identified Victim: none History of harm to others?: No Assessment of Violence: In past 6-12 months Violent Behavior Description: pt denies hx violence - is cooperative during assessment Does patient have access to weapons?: No Criminal Charges Pending?: No Does patient have a court date: No  Psychosis Hallucinations: None noted Delusions: None noted  Mental Status Report Appear/Hygiene: Disheveled Eye Contact: Good Motor Activity: Freedom of movement Speech: Logical/coherent Level of Consciousness: Alert Mood: Other (Comment) (euthymic) Affect: Appropriate to circumstance;Other (Comment) (euthymic) Anxiety Level: None Thought Processes:  Relevant;Coherent Judgement: Unimpaired Orientation: Person;Place;Situation;Time Obsessive Compulsive Thoughts/Behaviors: None  Cognitive Functioning Concentration: Normal Memory: Recent Intact;Remote Intact IQ: Average Insight: Fair Impulse Control: Poor Appetite: Fair Sleep: No Change Total Hours of Sleep: 5 Vegetative Symptoms: None  ADLScreening [facility] Assessment Services) Patient's cognitive ability adequate to safely complete daily activities?: Yes Patient able to express need for assistance with ADLs?: Yes Independently performs ADLs?: Yes (appropriate for developmental age)  Prior Inpatient Therapy Prior Inpatient Therapy: No Prior Therapy Dates: na Prior Therapy Facilty/Provider(s): na Reason for Treatment: na  Prior Outpatient Therapy Prior Outpatient Therapy: No Prior Therapy Dates: na Prior Therapy Facilty/Provider(s): na Reason for Treatment: na  ADL Screening (condition at time of admission) Patient's cognitive ability adequate to safely complete daily activities?: Yes Is the patient deaf or have difficulty hearing?: No Does the patient have difficulty seeing, even when wearing glasses/contacts?: No Does the patient have difficulty concentrating, remembering, or making decisions?: No Patient able to express need for assistance with ADLs?: Yes Does the patient have difficulty dressing or bathing?: No Independently performs ADLs?: Yes (appropriate for developmental age) Does the patient have difficulty walking or climbing stairs?: No Weakness of Legs: None Weakness of Arms/Hands: None  Home Assistive Devices/Equipment Home Assistive Devices/Equipment: None    Abuse/Neglect Assessment (Assessment to be complete while patient  is alone) Physical Abuse: Denies Verbal Abuse: Denies Sexual Abuse: Denies Exploitation of patient/patient's resources: Denies Self-Neglect: Denies Values / Beliefs Cultural Requests During Hospitalization: None Spiritual Requests  During Hospitalization: None   Advance Directives (For Healthcare) Advance Directive: Patient does not have advance directive;Patient would not like information    Additional Information 1:1 In Past 12 Months?: No CIRT Risk: No Elopement Risk: No Does patient have medical clearance?: Yes     Disposition:  Disposition Initial Assessment Completed for this Encounter: Yes Disposition of Patient: Other dispositions Other disposition(s): Other (Comment) (pt to be taken by TASK counselor to [facility] when med clear)  Braden Deloach P 03/01/2013 10:11 AM

## 2013-03-01 NOTE — ED Notes (Signed)
Patient discharge at this time. No distress upon departure.

## 2018-08-05 ENCOUNTER — Other Ambulatory Visit: Payer: Self-pay

## 2018-08-05 ENCOUNTER — Emergency Department (HOSPITAL_COMMUNITY)
Admission: EM | Admit: 2018-08-05 | Discharge: 2018-08-05 | Disposition: A | Discharge status: Home / Self Care | Payer: Self-pay | Attending: Emergency Medicine | Admitting: Emergency Medicine

## 2018-08-05 ENCOUNTER — Emergency Department (HOSPITAL_COMMUNITY): Payer: Self-pay

## 2018-08-05 ENCOUNTER — Encounter (HOSPITAL_COMMUNITY): Payer: Self-pay | Admitting: Emergency Medicine

## 2018-08-05 DIAGNOSIS — F17210 Nicotine dependence, cigarettes, uncomplicated: Secondary | ICD-10-CM | POA: Insufficient documentation

## 2018-08-05 DIAGNOSIS — L02511 Cutaneous abscess of right hand: Secondary | ICD-10-CM | POA: Insufficient documentation

## 2018-08-05 MED ORDER — LIDOCAINE HCL (PF) 2 % IJ SOLN
10.0000 mL | Freq: Once | INTRAMUSCULAR | Status: AC
  Administered 2018-08-05: 5 mL via INTRADERMAL

## 2018-08-05 MED ORDER — TRAMADOL HCL 50 MG PO TABS: 50.0000 mg | ORAL_TABLET | Freq: Four times a day (QID) | ORAL | 0 refills | Status: DC | PRN

## 2018-08-05 MED ORDER — CEPHALEXIN 500 MG PO CAPS: 500.0000 mg | ORAL_CAPSULE | Freq: Four times a day (QID) | ORAL | 0 refills | Status: DC

## 2018-08-05 MED ORDER — DOXYCYCLINE HYCLATE 100 MG PO CAPS: 100.0000 mg | ORAL_CAPSULE | Freq: Two times a day (BID) | ORAL | 0 refills | Status: DC

## 2018-08-05 MED ORDER — LIDOCAINE HCL (PF) 2 % IJ SOLN
INTRAMUSCULAR | Status: AC
  Filled 2018-08-05: qty 20

## 2018-08-05 MED ORDER — HYDROCODONE-ACETAMINOPHEN 5-325 MG PO TABS
1.0000 | ORAL_TABLET | Freq: Once | ORAL | Status: AC
  Administered 2018-08-05: 1 via ORAL
  Filled 2018-08-05: qty 1

## 2018-08-05 MED ORDER — POVIDONE-IODINE 10 % EX SOLN
CUTANEOUS | Status: AC
  Administered 2018-08-05: 17:00:00
  Filled 2018-08-05: qty 15

## 2018-08-05 NOTE — Discharge Instructions (Signed)
You can take Tylenol or ibuprofen for mild pain. Take Tramadol for more severe pain as needed Take Keflex 500mg  4 times a day for 5 days and take Doxycycline 100mg  twice a day for one week (antibiotic)  Keep wound clean with warm soap and water and keep bandage dry, do not submerge in water for 24 hours. Change bandage sooner if it gets dirty Return for fever, increased redness, swelling, pain, or worsening drainage Call Dr. Debby Bud office on Monday for a follow up appointment or come back to the ED for a wound recheck

## 2018-08-05 NOTE — ED Notes (Signed)
PA in to assess 

## 2018-08-05 NOTE — ED Notes (Signed)
PA in for procedure

## 2018-08-05 NOTE — ED Provider Notes (Addendum)
[facility] EMERGENCY DEPARTMENT Provider Note   CSN: [id number] Arrival date & time: 08/05/18  1501    History   Chief Complaint Chief Complaint  Patient presents with  . Hand Pain    HPI Erik Burns is a 53 y.o. male who presents with right index finger swelling.  Past medical history significant for substance abuse.  The patient states that he got a splinter in his right index finger approximately 3 months ago.  It did not cause him any problems at the time and he thinks he got most of it out.  There is a small scab over the area which she has been picking at.  Over the past 3 weeks he has had gradually worsening pain, swelling, redness over the proximal and anterior aspect of the index finger.  There has been some purulent drainage from the area. He denies fever.  He is having difficulty flexing his finger.  He came today because someone told him the infection go to the bone or he could lose his hand which worried him so he decided to come to the ED.   HPI  History reviewed. No pertinent past medical history.  There are no active problems to display for this patient.   History reviewed. No pertinent surgical history.      Home Medications    Prior to Admission medications   Not on File    Family History History reviewed. No pertinent family history.  Social History Social History   Tobacco Use  . Smoking status: Current Every Day Smoker    Packs/day: 1.00    Years: 28.00    Pack years: 28.00    Types: Cigarettes  . Smokeless tobacco: Never Used  Substance Use Topics  . Alcohol use: Yes    Alcohol/week: 12.0 standard drinks    Types: 12 Cans of beer per week    Comment: everyday  . Drug use: Yes    Types: Marijuana     Allergies   Patient has no active allergies.   Review of Systems Review of Systems  Constitutional: Negative for fever.  Musculoskeletal: Positive for arthralgias and joint swelling.  Skin: Positive for color change and wound.     Physical Exam Updated Vital Signs BP (!) 159/100 (BP Location: Right Arm)   Pulse 89   Temp 98.4 F (36.9 C) (Oral)   Resp 18   Ht 5\' 6"  (1.676 m)   Wt 53.5 kg   SpO2 98%   BMI 19.05 kg/m   Physical Exam Vitals signs and nursing note reviewed.  Constitutional:      General: He is not in acute distress.    Appearance: He is well-developed.  HENT:     Head: Normocephalic and atraumatic.  Eyes:     General: No scleral icterus.       Right eye: No discharge.        Left eye: No discharge.     Conjunctiva/sclera: Conjunctivae normal.     Pupils: Pupils are equal, round, and reactive to light.  Neck:     Musculoskeletal: Normal range of motion.  Cardiovascular:     Rate and Rhythm: Normal rate.  Pulmonary:     Effort: Pulmonary effort is normal. No respiratory distress.  Abdominal:     General: There is no distension.  Musculoskeletal:     Comments: Right hand: There is a scabbed over wound/callus over the palm just proximal to the index finger. There is a large amount of swelling  over the index finger from the MCP joint of the index finger to the PIP joint that extends laterally. There is fluctuance and tenderness with decreased ROM. 2+ radial pulse.  Skin:    General: Skin is warm and dry.  Neurological:     Mental Status: He is alert and oriented to person, place, and time.  Psychiatric:        Behavior: Behavior normal.          ED Treatments / Results  Labs (all labs ordered are listed, but only abnormal results are displayed) Labs Reviewed - No data to display  EKG None  Radiology Dg Hand Complete Right  Result Date: 08/05/2018 CLINICAL DATA:  Right hand pain and swelling around the base of the second digit. Splinter in the hand 2 months ago. EXAM: RIGHT HAND - COMPLETE 3+ VIEW COMPARISON:  None. FINDINGS: No acute fracture or dislocation is identified. There is prominent soft tissue swelling in the proximal aspect of the index finger extending to the  base of the long finger. No radiopaque foreign body or soft tissue emphysema is seen. There is no underlying osseous erosion. A small focus of benign appearing periosteal new bone formation is noted along the radial aspect of the proximal phalanx of the index finger. Mild degenerative osteophyte formation is noted at the second and third MCP joints. Mild-to-moderate degenerative changes are noted at the first [facility] joint. There is an abnormal appearance of the entirety of the shaft of the fifth metacarpal which appears mildly expanded with heterogeneous lucency and mild bowing. No gross bone destruction, suspicious periosteal reaction, or adjacent soft tissue mass is identified. IMPRESSION: 1. Soft tissue swelling primarily in the index finger without acute osseous abnormality or radiopaque foreign body identified. 2. Benign-appearing lesion in the fifth metacarpal, possibly fibrous dysplasia or an enchondroma Electronically Signed   By: Sebastian Ache M.D.   On: 08/05/2018 15:54    Procedures .Marland KitchenIncision and Drainage Date/Time: 08/05/2018 5:02 PM Performed by: Bethel Born, PA-C Authorized by: Bethel Born, PA-C   Consent:    Consent obtained:  Verbal   Consent given by:  Patient   Risks discussed:  Bleeding, incomplete drainage, pain and damage to other organs   Alternatives discussed:  No treatment Universal protocol:    Procedure explained and questions answered to patient or proxy's satisfaction: yes     Relevant documents present and verified: yes     Test results available and properly labeled: yes     Imaging studies available: yes     Required blood products, implants, devices, and special equipment available: yes     Site/side marked: yes     Immediately prior to procedure a time out was called: yes     Patient identity confirmed:  Verbally with patient Location:    Type:  Abscess Pre-procedure details:    Skin preparation:  Betadine Anesthesia (see MAR for exact dosages):     Anesthesia method:  Local infiltration   Local anesthetic:  Lidocaine 2% w/o epi Procedure type:    Complexity:  Simple Procedure details:    Incision types:  Single straight   Incision depth:  Subcutaneous   Scalpel blade:  11   Wound management:  Probed and deloculated, irrigated with saline and extensive cleaning   Drainage:  Purulent and bloody   Drainage amount:  Copious   Wound treatment:  Wound left open   Packing materials:  None Post-procedure details:    Patient tolerance of procedure:  Tolerated well, no immediate complications   (including critical care time)    Medications Ordered in ED Medications - No data to display   Initial Impression / Assessment and Plan / ED Course  I have reviewed the triage vital signs and the nursing notes.  Pertinent labs & imaging results that were available during my care of the patient were reviewed by me and considered in my medical decision making (see chart for details).  [age] year old male presents with large amount of swelling of the right index finger that has been gradually worsening for several weeks. He reports there has been purulent drainage from the area. It is soft and fluctuant. Xray was obtained to evaluate for FB or bony destruction which is negative. Will discuss with hand surgery.  Spoke with Dr. Izora Ribasoley with hand. He recommends bedside I&D, antibiotics, and f/u in the office or ED for a wound recheck.   I&D was performed and copious purulent drainage was expressed. Pt tolerated procedure fairly well. Dressing was applied. He was given rx for pain, and Keflex and Doxy. He was instructed to follow up with Dr. Izora Ribasoley or return to the ED in the next 24-48 hours for wound recheck  Final Clinical Impressions(s) / ED Diagnoses   Final diagnoses:  Abscess of finger, right    ED Discharge Orders    None       Bethel BornGekas, Elbridge Magowan Marie, PA-C 08/05/18 1702    Bethel BornGekas, Leslee Haueter Marie, PA-C 08/05/18 1704    Samuel JesterMcManus, Kathleen,  DO 08/07/18 1621

## 2018-08-05 NOTE — ED Notes (Signed)
L PP index finger swollen and reddened- pt reports worse in the last 2 weeks  He reports he had a doctor in [place] several years ago, but has not got one here

## 2018-08-05 NOTE — ED Triage Notes (Signed)
Pt states he got a splinter in his hand two months ago, now states swelling in his right hand and fingers

## 2018-08-05 NOTE — ED Notes (Signed)
Pt reports several month hx of splinter with hand swelling   Here for eval

## 2019-02-17 ENCOUNTER — Emergency Department (HOSPITAL_COMMUNITY)
Admission: EM | Admit: 2019-02-17 | Discharge: 2019-02-17 | Disposition: A | Discharge status: Home / Self Care | Payer: Self-pay | Attending: Emergency Medicine | Admitting: Emergency Medicine

## 2019-02-17 ENCOUNTER — Emergency Department (HOSPITAL_COMMUNITY): Payer: Self-pay

## 2019-02-17 ENCOUNTER — Encounter (HOSPITAL_COMMUNITY): Payer: Self-pay | Admitting: Emergency Medicine

## 2019-02-17 ENCOUNTER — Other Ambulatory Visit: Payer: Self-pay

## 2019-02-17 DIAGNOSIS — F17210 Nicotine dependence, cigarettes, uncomplicated: Secondary | ICD-10-CM | POA: Insufficient documentation

## 2019-02-17 DIAGNOSIS — F1210 Cannabis abuse, uncomplicated: Secondary | ICD-10-CM | POA: Insufficient documentation

## 2019-02-17 DIAGNOSIS — Z79899 Other long term (current) drug therapy: Secondary | ICD-10-CM | POA: Insufficient documentation

## 2019-02-17 DIAGNOSIS — M7989 Other specified soft tissue disorders: Secondary | ICD-10-CM | POA: Insufficient documentation

## 2019-02-17 MED ORDER — IBUPROFEN 800 MG PO TABS: 800.0000 mg | ORAL_TABLET | Freq: Three times a day (TID) | ORAL | 0 refills | Status: DC

## 2019-02-17 MED ORDER — IBUPROFEN 800 MG PO TABS
800.0000 mg | ORAL_TABLET | Freq: Once | ORAL | Status: AC
  Administered 2019-02-17: 800 mg via ORAL
  Filled 2019-02-17: qty 1

## 2019-02-17 MED ORDER — SULFAMETHOXAZOLE-TRIMETHOPRIM 800-160 MG PO TABS: 1.0000 | ORAL_TABLET | Freq: Two times a day (BID) | ORAL | 0 refills | Status: AC

## 2019-02-17 MED ORDER — TRAMADOL HCL 50 MG PO TABS: 50.0000 mg | ORAL_TABLET | Freq: Four times a day (QID) | ORAL | 0 refills | Status: DC | PRN

## 2019-02-17 NOTE — ED Triage Notes (Signed)
Pt reports his left middle finger is swollen, painful, and hard to bend with no injury since yesterday.

## 2019-02-17 NOTE — Discharge Instructions (Signed)
Your xray is negative for any bony injury or obvious source of your symptoms.  It is possible this is an infection, but without any injury to the skin, this is less likely the cause.  Take the antibiotics prescribed to cover for this possibility.  I have also prescribed medicine for pain and swelling.  Soak your hand in warm water bath for 15 minutes several times daily.  Get rechecked here if your symptoms worsen or are not improving with todays treatment.

## 2019-02-17 NOTE — ED Provider Notes (Signed)
[facility] EMERGENCY DEPARTMENT Provider Note   CSN: [id number] Arrival date & time: 02/17/19  1343     History   Chief Complaint Chief Complaint  Patient presents with  . Hand Pain    HPI Erik Burns is a 53 y.o. male presenting with pain and swelling at the base of his left long finger which started yesterday. He denies any injury, but endorses chronic overuse at work, stating he uses a shovel frequently with his job.  Pain is throbbing, no radiation, denies injury or puncture to skin. He has difficulty with full flexion of the finger secondary to pain and swelling.  He has had no treatment prior to arrival.      The history is provided by the patient.    History reviewed. No pertinent past medical history.  There are no active problems to display for this patient.   History reviewed. No pertinent surgical history.      Home Medications    Prior to Admission medications   Medication Sig Start Date End Date Taking? Authorizing Provider  cephALEXin (KEFLEX) 500 MG capsule Take 1 capsule (500 mg total) by mouth 4 (four) times daily. 08/05/18   Bethel Born, PA-C  doxycycline (VIBRAMYCIN) 100 MG capsule Take 1 capsule (100 mg total) by mouth 2 (two) times daily. 08/05/18   Bethel Born, PA-C  ibuprofen (ADVIL) 800 MG tablet Take 1 tablet (800 mg total) by mouth 3 (three) times daily. 02/17/19   Burgess Amor, PA-C  sulfamethoxazole-trimethoprim (BACTRIM DS) 800-160 MG tablet Take 1 tablet by mouth 2 (two) times daily for 10 days. 02/17/19 02/27/19  Burgess Amor, PA-C  traMADol (ULTRAM) 50 MG tablet Take 1 tablet (50 mg total) by mouth every 6 (six) hours as needed. 02/17/19   Burgess Amor, PA-C    Family History History reviewed. No pertinent family history.  Social History Social History   Tobacco Use  . Smoking status: Current Every Day Smoker    Packs/day: 1.00    Years: 28.00    Pack years: 28.00    Types: Cigarettes  . Smokeless tobacco: Never Used   Substance Use Topics  . Alcohol use: Yes    Alcohol/week: 12.0 standard drinks    Types: 12 Cans of beer per week    Comment: everyday  . Drug use: Yes    Types: Marijuana     Allergies   Patient has no known allergies.   Review of Systems Review of Systems  Constitutional: Negative for fever.  Musculoskeletal: Positive for arthralgias and joint swelling. Negative for myalgias.  Neurological: Negative for weakness and numbness.     Physical Exam Updated Vital Signs BP (!) 119/97 (BP Location: Right Arm)   Pulse 78   Temp 97.8 F (36.6 C) (Oral)   Resp 16   Ht 5\' 6"  (1.676 m)   Wt 54.4 kg   SpO2 99%   BMI 19.37 kg/m   Physical Exam Constitutional:      Appearance: He is well-developed.  HENT:     Head: Atraumatic.  Neck:     Musculoskeletal: Normal range of motion.  Cardiovascular:     Comments: Pulses equal bilaterally Musculoskeletal:        General: Tenderness present.     Left hand: He exhibits tenderness and swelling. He exhibits normal capillary refill and no deformity. Normal sensation noted.     Comments: ttp left long finger middle and proximal phalanx. Edema, mild erythema.  Skin intact, no wounds.  Partial flexion of the digit with tenderness at the site, no radiation of pain into the hand.   Skin:    General: Skin is warm and dry.  Neurological:     Mental Status: He is alert.     Sensory: No sensory deficit.     Deep Tendon Reflexes: Reflexes normal.      ED Treatments / Results  Labs (all labs ordered are listed, but only abnormal results are displayed) Labs Reviewed - No data to display  EKG None  Radiology Dg Hand Complete Left  Result Date: 02/17/2019 CLINICAL DATA:  LEFT middle finger pain and swelling, hard to bend, symptoms since yesterday, no injury EXAM: LEFT HAND - COMPLETE 3+ VIEW COMPARISON:  LEFT wrist radiographs 10/17/2011 FINDINGS: Soft tissue swelling LEFT middle finger. Bones appear demineralized. Joint spaces  preserved. No acute fracture, dislocation, or bone destruction. No erosive changes. Deformity of the distal radial metaphysis from old healed fracture. Question ulnar plus variance though exam is centered at the hand not the wrist cough, limiting assessment. IMPRESSION: Soft tissue swelling LEFT middle finger without acute bony abnormalities. Deformity of distal LEFT radius from old healed metaphyseal fracture. Question ulnar plus variance. Electronically Signed   By: Lavonia Dana M.D.   On: 02/17/2019 14:48    Procedures Procedures (including critical care time)  Medications Ordered in ED Medications  ibuprofen (ADVIL) tablet 800 mg (800 mg Oral Given 02/17/19 1555)     Initial Impression / Assessment and Plan / ED Course  I have reviewed the triage vital signs and the nursing notes.  Pertinent labs & imaging results that were available during my care of the patient were reviewed by me and considered in my medical decision making (see chart for details).        Patient with left long proximal finger pain and edema, unclear etiology, possibly overuse/tendinitis, less likely infection as there is no obvious possible source with intact skin and no history of cuts, punctures.  He was covered with Bactrim, ibuprofen and Ultram also prescribed for inflammation and pain relief.  Discussed warm compresses or Epsom salt soak twice daily.  Patient has no history of gout.  Doubt this as the source of his symptoms.  Advised follow-up if symptoms persist or worsen.  Final Clinical Impressions(s) / ED Diagnoses   Final diagnoses:  Swelling of finger, left    ED Discharge Orders         Ordered    sulfamethoxazole-trimethoprim (BACTRIM DS) 800-160 MG tablet  2 times daily     02/17/19 1535    ibuprofen (ADVIL) 800 MG tablet  3 times daily     02/17/19 1535    traMADol (ULTRAM) 50 MG tablet  Every 6 hours PRN     02/17/19 1535           Evalee Jefferson, PA-C 02/17/19 1559    Milton Ferguson,  MD 02/18/19 [phone]

## 2019-09-18 ENCOUNTER — Encounter: Payer: Self-pay | Admitting: Emergency Medicine

## 2019-09-18 ENCOUNTER — Ambulatory Visit
Admission: EM | Admit: 2019-09-18 | Discharge: 2019-09-18 | Disposition: A | Discharge status: Home / Self Care | Payer: Self-pay | Attending: Emergency Medicine | Admitting: Emergency Medicine

## 2019-09-18 DIAGNOSIS — Z789 Other specified health status: Secondary | ICD-10-CM

## 2019-09-18 DIAGNOSIS — F1090 Alcohol use, unspecified, uncomplicated: Secondary | ICD-10-CM

## 2019-09-18 DIAGNOSIS — R1013 Epigastric pain: Secondary | ICD-10-CM

## 2019-09-18 MED ORDER — ONDANSETRON HCL 4 MG PO TABS: 4.0000 mg | ORAL_TABLET | Freq: Four times a day (QID) | ORAL | 0 refills | Status: DC

## 2019-09-18 MED ORDER — OMEPRAZOLE 20 MG PO CPDR: 20.0000 mg | DELAYED_RELEASE_CAPSULE | Freq: Every day | ORAL | 0 refills | Status: DC

## 2019-09-18 MED ORDER — ALUM & MAG HYDROXIDE-SIMETH 200-200-20 MG/5ML PO SUSP
30.0000 mL | Freq: Once | ORAL | Status: AC
  Administered 2019-09-18: 30 mL via ORAL

## 2019-09-18 NOTE — Discharge Instructions (Signed)
Unable to rule out pancreatitis, liver disease, gallbladder disease, ulcer in urgent care setting.  Offered patient further evaluation and management in the ED.  Patient declines at this time and would like to try outpatient therapy first.  Aware of the risk associated with this decision including missed diagnosis, organ damage, organ failure, and/or death.  Patient aware and in agreement.     GI cocktail given in office Omeprazole prescribed take daily Zofran prescribed.  Take as needed for nausea.   Avoid eating 2-3 hours before bed Elevate head of bed.  Avoid chocolate, caffeine, alcohol, onion, and mint prior to bed.  This relaxes the bottom part of your esophagus and can make your symptoms worse.  If you experience new or worsening symptoms return or go to ER such as fever, chills, nausea, vomiting, diarrhea, bloody or dark tarry stools, constipation, urinary symptoms, worsening abdominal discomfort, symptoms that do not improve with medications, inability to keep fluids down, etc..Marland Kitchen

## 2019-09-18 NOTE — ED Provider Notes (Signed)
[facility]   [id number] 09/18/19 Arrival Time: 1412  CC: ABDOMINAL DISCOMFORT  SUBJECTIVE:  Erik Burns is a 54 y.o. male who presents with complaint of abdominal discomfort that began 2-3 weeks ago.  Denies a precipitating event, trauma, close contacts with similar symptoms, recent travel or antibiotic use.  Localizes pain to epigastric discomfort.  Describes as stable, intermittent, occurs daily, and "just hurts" in character.  Has tried [facility] powders and extra strength tylenol without relief.  Worse with drinking alcohol and water.  Reports similar symptoms in the past with acid reflux.  Last BM this morning and normal for patient.  Complains of associated nausea, 3 episodes of vomiting.  Denies fever, chills, appetite changes, weight changes, chest pain, SOB, diarrhea, constipation, hematochezia, melena, dysuria, difficulty urinating, increased frequency or urgency, flank pain, loss of bowel or bladder function.  Admits to alcohol use.  Drinks 24 oz 5 days/ week, and goes through two 12 packs over the weekend.    ROS: As per HPI.  All other pertinent ROS negative.     History reviewed. No pertinent past medical history. History reviewed. No pertinent surgical history. No Known Allergies No current facility-administered medications on file prior to encounter.   No current outpatient medications on file prior to encounter.   Social History   Socioeconomic History  . Marital status: Single    Spouse name: Not on file  . Number of children: Not on file  . Years of education: Not on file  . Highest education level: Not on file  Occupational History  . Not on file  Tobacco Use  . Smoking status: Current Every Day Smoker    Packs/day: 1.00    Years: 28.00    Pack years: 28.00    Types: Cigarettes  . Smokeless tobacco: Never Used  Substance and Sexual Activity  . Alcohol use: Yes    Alcohol/week: 12.0 standard drinks    Types: 12 Cans of beer per week    Comment:  everyday  . Drug use: Yes    Types: Marijuana  . Sexual activity: Yes    Birth control/protection: None  Other Topics Concern  . Not on file  Social History Narrative  . Not on file   Social Determinants of Health   Financial Resource Strain:   . Difficulty of Paying Living Expenses:   Food Insecurity:   . Worried About Programme researcher, broadcasting/film/video in the Last Year:   . Barista in the Last Year:   Transportation Needs:   . Freight forwarder (Medical):   Marland Kitchen Lack of Transportation (Non-Medical):   Physical Activity:   . Days of Exercise per Week:   . Minutes of Exercise per Session:   Stress:   . Feeling of Stress :   Social Connections:   . Frequency of Communication with Friends and Family:   . Frequency of Social Gatherings with Friends and Family:   . Attends Religious Services:   . Active Member of Clubs or Organizations:   . Attends Banker Meetings:   Marland Kitchen Marital Status:   Intimate Partner Violence:   . Fear of Current or Ex-Partner:   . Emotionally Abused:   Marland Kitchen Physically Abused:   . Sexually Abused:    No family history on file.   OBJECTIVE:  Vitals:   09/18/19 1451  BP: 112/67  Resp: 16  Temp: 99.2 F (37.3 C)  TempSrc: Oral  SpO2: 97%  Weight: 115 lb (52.2  kg)    General appearance: Alert; NAD HEENT: NCAT.  Oropharynx clear.  Lungs: clear to auscultation bilaterally without adventitious breath sounds Heart: regular rate and rhythm.  Radial pulses 2+ symmetrical bilaterally Abdomen: soft, non-distended; normal active bowel sounds; non-tender to light and deep palpation; nontender at McBurney's point; negative Murphy's sign; negative rebound; no guarding Back: no CVA tenderness Extremities: no edema; symmetrical with no gross deformities Skin: warm and dry Neurologic: normal gait Psychological: alert and cooperative; normal mood and affect  ASSESSMENT & PLAN:  1. Abdominal pain, epigastric     Meds ordered this encounter    Medications  . omeprazole (PRILOSEC) 20 MG capsule    Sig: Take 1 capsule (20 mg total) by mouth daily.    Dispense:  30 capsule    Refill:  0    Order Specific Question:   Supervising Provider    Answer:   Eustace Moore [[id number]]  . ondansetron (ZOFRAN) 4 MG tablet    Sig: Take 1 tablet (4 mg total) by mouth every 6 (six) hours.    Dispense:  12 tablet    Refill:  0    Order Specific Question:   Supervising Provider    Answer:   Eustace Moore [[id number]]  . alum & mag hydroxide-simeth (MAALOX/MYLANTA) 200-200-20 MG/5ML suspension 30 mL   Unable to rule out pancreatitis, liver disease, gallbladder disease, ulcer in urgent care setting.  Offered patient further evaluation and management in the ED.  Patient declines at this time and would like to try outpatient therapy first.  Aware of the risk associated with this decision including missed diagnosis, organ damage, organ failure, and/or death.  Patient aware and in agreement.     GI cocktail given in office Omeprazole prescribed take daily Zofran prescribed.  Take as needed for nausea.   Avoid eating 2-3 hours before bed Elevate head of bed.  Avoid chocolate, caffeine, alcohol, onion, and mint prior to bed.  This relaxes the bottom part of your esophagus and can make your symptoms worse.  If you experience new or worsening symptoms return or go to ER such as fever, chills, nausea, vomiting, diarrhea, bloody or dark tarry stools, constipation, urinary symptoms, worsening abdominal discomfort, symptoms that do not improve with medications, inability to keep fluids down, etc...  Reviewed expectations re: course of current medical issues. Questions answered. Outlined signs and symptoms indicating need for more acute intervention. Patient verbalized understanding. After Visit Summary given.   Rennis Harding, PA-C 09/18/19 1514

## 2019-09-18 NOTE — ED Triage Notes (Signed)
Started having abdominal pain for about 2 weeks in epigastric region.  The pain will come and go but stays a lot.

## 2020-03-27 ENCOUNTER — Other Ambulatory Visit: Payer: Self-pay

## 2020-03-27 ENCOUNTER — Emergency Department (HOSPITAL_COMMUNITY)
Admission: EM | Admit: 2020-03-27 | Discharge: 2020-03-27 | Disposition: A | Discharge status: Home / Self Care | Payer: HRSA Program | Attending: Emergency Medicine | Admitting: Emergency Medicine

## 2020-03-27 ENCOUNTER — Encounter (HOSPITAL_COMMUNITY): Payer: Self-pay

## 2020-03-27 DIAGNOSIS — M791 Myalgia, unspecified site: Secondary | ICD-10-CM | POA: Insufficient documentation

## 2020-03-27 DIAGNOSIS — U071 COVID-19: Secondary | ICD-10-CM | POA: Insufficient documentation

## 2020-03-27 DIAGNOSIS — F17210 Nicotine dependence, cigarettes, uncomplicated: Secondary | ICD-10-CM | POA: Diagnosis not present

## 2020-03-27 DIAGNOSIS — R110 Nausea: Secondary | ICD-10-CM | POA: Diagnosis present

## 2020-03-27 MED ORDER — ONDANSETRON HCL 4 MG PO TABS
4.0000 mg | ORAL_TABLET | Freq: Once | ORAL | Status: AC
  Administered 2020-03-27: 4 mg via ORAL
  Filled 2020-03-27: qty 1

## 2020-03-27 MED ORDER — ONDANSETRON HCL 4 MG PO TABS: 4.0000 mg | ORAL_TABLET | Freq: Three times a day (TID) | ORAL | 0 refills | Status: DC | PRN

## 2020-03-27 MED ORDER — IBUPROFEN 800 MG PO TABS
800.0000 mg | ORAL_TABLET | Freq: Once | ORAL | Status: AC
  Administered 2020-03-27: 800 mg via ORAL
  Filled 2020-03-27: qty 1

## 2020-03-27 NOTE — ED Notes (Signed)
PA at bedside.

## 2020-03-27 NOTE — ED Provider Notes (Signed)
[facility] EMERGENCY DEPARTMENT Provider Note   CSN: [id number] Arrival date & time: 03/27/20  1133     History Chief Complaint  Patient presents with  . Covid Positive    Erik Burns is a 55 y.o. male who presents today with concern for nausea and body aches in context of known COVID-19 infection.  Patient now on day 8 of symptoms.  States that he tested with the health department on 03/21/2020, was called yesterday morning to be informed that was confirmed positive test. Patient states he presents today primarily because he continues to be fatigued, did not feel up to going to work today, however his boss said that if he did not have a work note he will be fired.  He endorses cough prior, however states he is feeling much better now.  Endorses intermittent dyspnea on exertion, nausea. Denies vomiting, denies diarrhea at this time.  I personally read this patient's medical records.  He does not carry any medical diagnoses, is not on any medications every day.   HPI     History reviewed. No pertinent past medical history.  There are no problems to display for this patient.   History reviewed. No pertinent surgical history.     No family history on file.  Social History   Tobacco Use  . Smoking status: Current Every Day Smoker    Packs/day: 1.00    Years: 28.00    Pack years: 28.00    Types: Cigarettes  . Smokeless tobacco: Never Used  Substance Use Topics  . Alcohol use: Yes    Alcohol/week: 2.0 standard drinks    Types: 2 Cans of beer per week    Comment: 2 24 oz daily  . Drug use: Yes    Types: Marijuana    Home Medications Prior to Admission medications   Medication Sig Start Date End Date Taking? Authorizing Provider  ondansetron (ZOFRAN) 4 MG tablet Take 1 tablet (4 mg total) by mouth every 8 (eight) hours as needed for nausea or vomiting. 03/27/20  Yes Vedder Brittian, Eugene Gavia, PA-C  omeprazole (PRILOSEC) 20 MG capsule Take 1 capsule (20 mg total) by mouth  daily. 09/18/19   Wurst, [place], PA-C    Allergies    Patient has no known allergies.  Review of Systems   Review of Systems  Constitutional: Positive for activity change, appetite change, chills, fatigue and fever.  HENT: Negative for congestion, sore throat, trouble swallowing and voice change.   Eyes: Negative.   Respiratory: Positive for cough. Negative for chest tightness and shortness of breath.   Cardiovascular: Negative for chest pain, palpitations and leg swelling.  Gastrointestinal: Positive for nausea. Negative for abdominal pain and vomiting.  Genitourinary: Negative.   Musculoskeletal: Positive for myalgias.  Neurological: Negative.     Physical Exam Updated Vital Signs BP (!) 121/93 (BP Location: Right Arm)   Pulse 68   Temp 98 F (36.7 C) (Oral)   Resp 18   Ht 5\' 6"  (1.676 m)   Wt 54.4 kg   SpO2 98%   BMI 19.37 kg/m   Physical Exam Vitals and nursing note reviewed.  HENT:     Head: Normocephalic and atraumatic.     Nose: Nose normal.     Mouth/Throat:     Mouth: Mucous membranes are moist.     Pharynx: Oropharynx is clear. Uvula midline. No oropharyngeal exudate or posterior oropharyngeal erythema.     Tonsils: No tonsillar exudate.  Eyes:     General:  Right eye: No discharge.        Left eye: No discharge.     Extraocular Movements: Extraocular movements intact.     Conjunctiva/sclera: Conjunctivae normal.     Pupils: Pupils are equal, round, and reactive to light.  Neck:     Trachea: Trachea and phonation normal.  Cardiovascular:     Rate and Rhythm: Normal rate and regular rhythm.     Pulses: Normal pulses.          Radial pulses are 2+ on the right side and 2+ on the left side.       Dorsalis pedis pulses are 2+ on the right side and 2+ on the left side.     Heart sounds: Normal heart sounds. No murmur heard.   Pulmonary:     Effort: Pulmonary effort is normal. No respiratory distress.     Breath sounds: Examination of the  right-lower field reveals rhonchi. Rhonchi present. No wheezing or rales.  Chest:     Chest wall: No deformity, swelling, tenderness, crepitus or edema.  Abdominal:     General: Bowel sounds are normal. There is no distension.     Palpations: Abdomen is soft.     Tenderness: There is no abdominal tenderness. There is no right CVA tenderness or left CVA tenderness.  Musculoskeletal:        General: No deformity.     Cervical back: Neck supple. No rigidity or crepitus. No pain with movement, spinous process tenderness or muscular tenderness.     Right lower leg: No edema.     Left lower leg: No edema.  Lymphadenopathy:     Cervical: No cervical adenopathy.  Skin:    General: Skin is warm and dry.     Capillary Refill: Capillary refill takes less than 2 seconds.  Neurological:     General: No focal deficit present.     Mental Status: He is alert and oriented to person, place, and time.     Sensory: Sensation is intact.     Motor: Motor function is intact.     Gait: Gait is intact.  Psychiatric:        Mood and Affect: Mood normal.     ED Results / Procedures / Treatments   Labs (all labs ordered are listed, but only abnormal results are displayed) Labs Reviewed - No data to display  EKG None  Radiology No results found.  Procedures Procedures (including critical care time)  Medications Ordered in ED Medications  ibuprofen (ADVIL) tablet 800 mg (has no administration in time range)  ondansetron (ZOFRAN) tablet 4 mg (has no administration in time range)    ED Course  I have reviewed the triage vital signs and the nursing notes.  Pertinent labs & imaging results that were available during my care of the patient were reviewed by me and considered in my medical decision making (see chart for details).    MDM Rules/Calculators/A&P                         55 year old male presents with concerns for persistent nausea and myalgias on day 8 of COVID-19 testing.  Patient has  tested positive for COVID-19.  Patient was not vaccinated against COVID-19.  Patient vital signs on intake were very reassuring.  Patient is not tachycardic or hypoxic, he is afebrile.  Physical exam is also reassuring.  Cardiopulmonary exam significant only for intermittent rhonchi in the right lung base.  Abdominal  exam is benign.  Motrin and antiemetic offered for nausea and myalgias.  Given reassuring physical exam and vital signs, no further work-up is warranted in the emergency department at this time.  Will provide work note.  Karo voiced understanding of his medical evaluation and treatment plan.  His questions were answered to his expressed satisfaction.  Return precautions are given.  Patient is well-appearing, stable, appropriate for discharge at this time.  OCTAVIANO MUKAI was evaluated in Emergency Department on 03/27/2020 for the symptoms described in the history of present illness. He was evaluated in the context of the global COVID-19 pandemic, which necessitated consideration that the patient might be at risk for infection with the SARS-CoV-2 virus that causes COVID-19. Institutional protocols and algorithms that pertain to the evaluation of patients at risk for COVID-19 are in a state of rapid change based on information released by regulatory bodies including the CDC and federal and state organizations. These policies and algorithms were followed during the patient's care in the ED.  This chart was dictated using voice recognition software, Dragon. Despite the best efforts of this provider to proofread and correct errors, errors may still occur which can change documentation meaning.  Final Clinical Impression(s) / ED Diagnoses Final diagnoses:  COVID-19    Rx / DC Orders ED Discharge Orders         Ordered    ondansetron (ZOFRAN) 4 MG tablet  Every 8 hours PRN        03/27/20 1346           Ekaterina Denise, Eugene Gavia, PA-C 03/27/20 1402    Bethann Berkshire, MD 03/28/20  [phone]

## 2020-03-27 NOTE — Discharge Instructions (Signed)
You were evaluated in the emergency room today for your symptoms secondary to known COVID-19 infection.  You were administered nausea medicine and ibuprofen in the ER.  May continue to take Tylenol or ibuprofen as you need to at home for your symptoms.  I encourage you to focus on getting plenty of rest and hydration.  You have been prescribed a nausea medicine that you may take as needed for nausea at home.  Return to the emergency department develop any chest pain, difficulty breathing, nausea or vomiting does not stop, or any other severe symptoms.

## 2020-03-27 NOTE — ED Triage Notes (Signed)
Pt reports that he took a covid test last Thursday and came back postive today. Pt reports nausea and body aches.

## 2020-03-27 NOTE — ED Notes (Signed)
Entered room and introduced self to patient. Pt appears to be resting in bed, respirations are even and unlabored with equal chest rise and fall. Bed is locked in the lowest position, side rails x2, call bell within reach. Pt educated on call light use and hourly rounding, verbalized understanding and in agreement at this time. All questions and concerns voiced addressed. Refreshments offered and provided per patient request.  Will continue to monitor.   

## 2020-03-29 ENCOUNTER — Telehealth (HOSPITAL_COMMUNITY): Payer: Self-pay | Admitting: Emergency Medicine

## 2020-03-29 MED ORDER — ONDANSETRON 4 MG PO TBDP: 4.0000 mg | ORAL_TABLET | Freq: Three times a day (TID) | ORAL | 0 refills | Status: DC | PRN

## 2020-03-29 NOTE — Telephone Encounter (Signed)
Patient seen in the ED 2 days ago. Needs Rx for Zofran. This was sent to Pharmacy but they do not have the order. Will print Rx.

## 2020-06-18 ENCOUNTER — Emergency Department (HOSPITAL_COMMUNITY)
Admission: EM | Admit: 2020-06-18 | Discharge: 2020-06-18 | Disposition: A | Discharge status: Home / Self Care | Payer: Self-pay | Attending: Emergency Medicine | Admitting: Emergency Medicine

## 2020-06-18 ENCOUNTER — Emergency Department (HOSPITAL_COMMUNITY): Payer: Self-pay

## 2020-06-18 ENCOUNTER — Other Ambulatory Visit: Payer: Self-pay

## 2020-06-18 ENCOUNTER — Encounter (HOSPITAL_COMMUNITY): Payer: Self-pay

## 2020-06-18 DIAGNOSIS — F17210 Nicotine dependence, cigarettes, uncomplicated: Secondary | ICD-10-CM | POA: Insufficient documentation

## 2020-06-18 DIAGNOSIS — R1010 Upper abdominal pain, unspecified: Secondary | ICD-10-CM | POA: Insufficient documentation

## 2020-06-18 LAB — CBC WITH DIFFERENTIAL/PLATELET
Abs Immature Granulocytes: 0.01 10*3/uL (ref 0.00–0.07)
Basophils Absolute: 0 10*3/uL (ref 0.0–0.1)
Basophils Relative: 1 %
Eosinophils Absolute: 0.2 10*3/uL (ref 0.0–0.5)
Eosinophils Relative: 4 %
HCT: 49 % (ref 39.0–52.0)
Hemoglobin: 16.6 g/dL (ref 13.0–17.0)
Immature Granulocytes: 0 %
Lymphocytes Relative: 32 %
Lymphs Abs: 1.4 10*3/uL (ref 0.7–4.0)
MCH: 32.5 pg (ref 26.0–34.0)
MCHC: 33.9 g/dL (ref 30.0–36.0)
MCV: 95.9 fL (ref 80.0–100.0)
Monocytes Absolute: 0.5 10*3/uL (ref 0.1–1.0)
Monocytes Relative: 12 %
Neutro Abs: 2.3 10*3/uL (ref 1.7–7.7)
Neutrophils Relative %: 51 %
Platelets: 273 10*3/uL (ref 150–400)
RBC: 5.11 MIL/uL (ref 4.22–5.81)
RDW: 14.4 % (ref 11.5–15.5)
WBC: 4.5 10*3/uL (ref 4.0–10.5)
nRBC: 0 % (ref 0.0–0.2)

## 2020-06-18 LAB — COMPREHENSIVE METABOLIC PANEL
ALT: 20 U/L (ref 0–44)
AST: 36 U/L (ref 15–41)
Albumin: 4.4 g/dL (ref 3.5–5.0)
Alkaline Phosphatase: 64 U/L (ref 38–126)
Anion gap: 11 (ref 5–15)
BUN: 9 mg/dL (ref 6–20)
CO2: 24 mmol/L (ref 22–32)
Calcium: 9.2 mg/dL (ref 8.9–10.3)
Chloride: 99 mmol/L (ref 98–111)
Creatinine, Ser: 1.18 mg/dL (ref 0.61–1.24)
GFR, Estimated: >60 mL/min (ref >60)
Glucose, Bld: 99 mg/dL (ref 70–99)
Potassium: 5 mmol/L (ref 3.5–5.1)
Sodium: 134 mmol/L — ABNORMAL LOW (ref 135–145)
Total Bilirubin: 1.3 mg/dL — ABNORMAL HIGH (ref 0.3–1.2)
Total Protein: 8.1 g/dL (ref 6.5–8.1)

## 2020-06-18 LAB — LIPASE, BLOOD: Lipase: 22 U/L (ref 11–51)

## 2020-06-18 MED ORDER — LIDOCAINE VISCOUS HCL 2 % MT SOLN
15.0000 mL | Freq: Once | OROMUCOSAL | Status: AC
  Administered 2020-06-18: 15 mL via ORAL
  Filled 2020-06-18: qty 15

## 2020-06-18 MED ORDER — SODIUM CHLORIDE 0.9 % IV BOLUS
1000.0000 mL | Freq: Once | INTRAVENOUS | Status: AC
  Administered 2020-06-18: 1000 mL via INTRAVENOUS

## 2020-06-18 MED ORDER — PANTOPRAZOLE SODIUM 20 MG PO TBEC: 20.0000 mg | DELAYED_RELEASE_TABLET | Freq: Every day | ORAL | 1 refills | Status: DC

## 2020-06-18 MED ORDER — PANTOPRAZOLE SODIUM 40 MG IV SOLR
40.0000 mg | Freq: Once | INTRAVENOUS | Status: AC
  Administered 2020-06-18: 40 mg via INTRAVENOUS
  Filled 2020-06-18: qty 40

## 2020-06-18 MED ORDER — ONDANSETRON 4 MG PO TBDP: ORAL_TABLET | ORAL | 0 refills | Status: DC

## 2020-06-18 MED ORDER — ALUM & MAG HYDROXIDE-SIMETH 200-200-20 MG/5ML PO SUSP
30.0000 mL | Freq: Once | ORAL | Status: AC
  Administered 2020-06-18: 30 mL via ORAL
  Filled 2020-06-18: qty 30

## 2020-06-18 NOTE — ED Triage Notes (Signed)
Pt to er room number 2, pt states that she is here for epigastric pain, states that he has a hx of acid reflux, states that he used to take a pill, but hasn't had it in a long time, pt states that his pain seems to come and go.  Pt states he has a decreased appetitive.

## 2020-06-18 NOTE — ED Provider Notes (Signed)
[facility] EMERGENCY DEPARTMENT Provider Note   CSN: [id number] Arrival date & time: 06/18/20  1039     History Chief Complaint  Patient presents with  . Abdominal Pain    Erik Burns is a 55 y.o. male.  Patient complains of epigastric abdominal pain.  Patient has not been vomiting and has not seen blood in his stool  The history is provided by the patient and medical records. No language interpreter was used.  Abdominal Pain Pain location:  Epigastric Pain quality: aching   Pain radiates to:  Does not radiate Pain severity:  Moderate Onset quality:  Sudden Timing:  Constant Associated symptoms: no chest pain, no cough, no diarrhea, no fatigue and no hematuria        History reviewed. No pertinent past medical history.  There are no problems to display for this patient.   History reviewed. No pertinent surgical history.     History reviewed. No pertinent family history.  Social History   Tobacco Use  . Smoking status: Current Every Day Smoker    Packs/day: 1.00    Years: 28.00    Pack years: 28.00    Types: Cigarettes  . Smokeless tobacco: Never Used  Vaping Use  . Vaping Use: Never used  Substance Use Topics  . Alcohol use: Yes    Alcohol/week: 2.0 standard drinks    Types: 2 Cans of beer per week    Comment: 2 24 oz daily  . Drug use: Yes    Types: Marijuana    Home Medications Prior to Admission medications   Medication Sig Start Date End Date Taking? Authorizing Provider  ondansetron (ZOFRAN ODT) 4 MG disintegrating tablet 4mg  ODT q4 hours prn nausea/vomit 06/18/20  Yes 08/18/20, MD  pantoprazole (PROTONIX) 20 MG tablet Take 1 tablet (20 mg total) by mouth daily. 06/18/20  Yes 08/18/20, MD  omeprazole (PRILOSEC) 20 MG capsule Take 1 capsule (20 mg total) by mouth daily. Patient not taking: Reported on 06/18/2020 09/18/19   Wurst, 11/19/19, PA-C  ondansetron (ZOFRAN) 4 MG tablet Take 1 tablet (4 mg total) by mouth every 8 (eight) hours as  needed for nausea or vomiting. Patient not taking: Reported on 06/18/2020 03/27/20   03/29/20, PA-C    Allergies    Patient has no known allergies.  Review of Systems   Review of Systems  Constitutional: Negative for appetite change and fatigue.  HENT: Negative for congestion, ear discharge and sinus pressure.   Eyes: Negative for discharge.  Respiratory: Negative for cough.   Cardiovascular: Negative for chest pain.  Gastrointestinal: Positive for abdominal pain. Negative for diarrhea.  Genitourinary: Negative for frequency and hematuria.  Musculoskeletal: Negative for back pain.  Skin: Negative for rash.  Neurological: Negative for seizures and headaches.  Psychiatric/Behavioral: Negative for hallucinations.    Physical Exam Updated Vital Signs BP 118/83   Pulse 60   Temp 98 F (36.7 C) (Oral)   Resp 18   Ht 5' 6.5" (1.689 m)   Wt 61.2 kg   SpO2 100%   BMI 21.46 kg/m   Physical Exam Vitals and nursing note reviewed.  Constitutional:      Appearance: He is well-developed.  HENT:     Head: Normocephalic.     Nose: Nose normal.  Eyes:     General: No scleral icterus.    Conjunctiva/sclera: Conjunctivae normal.  Neck:     Thyroid: No thyromegaly.  Cardiovascular:     Rate and Rhythm: Normal  rate and regular rhythm.     Heart sounds: No murmur heard. No friction rub. No gallop.   Pulmonary:     Breath sounds: No stridor. No wheezing or rales.  Chest:     Chest wall: No tenderness.  Abdominal:     General: There is no distension.     Tenderness: There is abdominal tenderness. There is no rebound.  Musculoskeletal:        General: Normal range of motion.     Cervical back: Neck supple.  Lymphadenopathy:     Cervical: No cervical adenopathy.  Skin:    Findings: No erythema or rash.  Neurological:     Mental Status: He is alert and oriented to person, place, and time.     Motor: No abnormal muscle tone.     Coordination: Coordination normal.   Psychiatric:        Behavior: Behavior normal.     ED Results / Procedures / Treatments   Labs (all labs ordered are listed, but only abnormal results are displayed) Labs Reviewed  COMPREHENSIVE METABOLIC PANEL - Abnormal; Notable for the following components:      Result Value   Sodium 134 (*)    Total Bilirubin 1.3 (*)    All other components within normal limits  CBC WITH DIFFERENTIAL/PLATELET  LIPASE, BLOOD    EKG None  Radiology DG ABD ACUTE 2+V W 1V CHEST  Result Date: 06/18/2020 CLINICAL DATA:  Epigastric pain. EXAM: DG ABDOMEN ACUTE WITH 1 VIEW CHEST COMPARISON:  No prior. FINDINGS: Minimal infiltrate right upper lung. Follow-up exam suggested to demonstrate clearing. No pleural effusion or pneumothorax. Heart size normal. Soft tissues the abdomen are unremarkable. No bowel distention or free air. Pelvic calcifications consistent phleboliths. No acute bony abnormality. IMPRESSION: 1. Minimal infiltrate right upper lung. Follow-up chest x-rays suggested to demonstrate clearing. 2.  No acute intra-abdominal abnormality. Electronically Signed   By: Maisie Fus  Register   On: 06/18/2020 12:42    Procedures Procedures   Medications Ordered in ED Medications  pantoprazole (PROTONIX) injection 40 mg (40 mg Intravenous Given 06/18/20 1110)  alum & mag hydroxide-simeth (MAALOX/MYLANTA) 200-200-20 MG/5ML suspension 30 mL (30 mLs Oral Given 06/18/20 1110)    And  lidocaine (XYLOCAINE) 2 % viscous mouth solution 15 mL (15 mLs Oral Given 06/18/20 1110)  sodium chloride 0.9 % bolus 1,000 mL (0 mLs Intravenous Stopped 06/18/20 1320)    ED Course  I have reviewed the triage vital signs and the nursing notes.  Pertinent labs & imaging results that were available during my care of the patient were reviewed by me and considered in my medical decision making (see chart for details).    MDM Rules/Calculators/A&P                          Patient with epigastric discomfort.  Patient improved  some with a GI cocktail.  He was put on protonic and Zofran.  Patient is referred to GI and also told to see a family doctor Final Clinical Impression(s) / ED Diagnoses Final diagnoses:  Pain of upper abdomen    Rx / DC Orders ED Discharge Orders         Ordered    pantoprazole (PROTONIX) 20 MG tablet  Daily        06/18/20 1431    ondansetron (ZOFRAN ODT) 4 MG disintegrating tablet        06/18/20 1431  Bethann Berkshire, MD 06/18/20 1446

## 2020-06-18 NOTE — Discharge Instructions (Signed)
Follow-up with Dr. Karilyn Cota next week for recheck.  You can also follow-up with your family doctor if you prefer

## 2020-07-09 ENCOUNTER — Encounter (HOSPITAL_COMMUNITY): Payer: Self-pay | Admitting: *Deleted

## 2020-07-09 ENCOUNTER — Emergency Department (HOSPITAL_COMMUNITY): Payer: Self-pay

## 2020-07-09 ENCOUNTER — Emergency Department (HOSPITAL_COMMUNITY)
Admission: EM | Admit: 2020-07-09 | Discharge: 2020-07-09 | Disposition: A | Discharge status: Home / Self Care | Payer: Self-pay | Attending: Emergency Medicine | Admitting: Emergency Medicine

## 2020-07-09 ENCOUNTER — Other Ambulatory Visit: Payer: Self-pay

## 2020-07-09 DIAGNOSIS — S92355A Nondisplaced fracture of fifth metatarsal bone, left foot, initial encounter for closed fracture: Secondary | ICD-10-CM | POA: Insufficient documentation

## 2020-07-09 DIAGNOSIS — S99102A Unspecified physeal fracture of left metatarsal, initial encounter for closed fracture: Secondary | ICD-10-CM

## 2020-07-09 DIAGNOSIS — F17210 Nicotine dependence, cigarettes, uncomplicated: Secondary | ICD-10-CM | POA: Insufficient documentation

## 2020-07-09 NOTE — ED Triage Notes (Signed)
Pt with left ankle pain since stepping down from a van on Sunday night.  Pt with pain with walking.

## 2020-07-09 NOTE — ED Provider Notes (Signed)
[facility] EMERGENCY DEPARTMENT Provider Note   CSN: [id number] Arrival date & time: 07/09/20  1136     History Chief Complaint  Patient presents with  . Ankle Pain    Erik Burns is a 55 y.o. male.  HPI 56 year old male with complaints of left ankle pain.  Patient reports that on Sunday he got back from a work trip and was a bit intoxicated, stepped out of the [place].  He felt like he twisted his ankle.  Initially he felt okay but over the last several days he has had pain with weightbearing on the left foot.  Denies any numbness or tingling.  Feels like he may have sprained his ankle.    History reviewed. No pertinent past medical history.  There are no problems to display for this patient.   History reviewed. No pertinent surgical history.     History reviewed. No pertinent family history.  Social History   Tobacco Use  . Smoking status: Current Every Day Smoker    Packs/day: 1.00    Years: 28.00    Pack years: 28.00    Types: Cigarettes  . Smokeless tobacco: Never Used  Vaping Use  . Vaping Use: Never used  Substance Use Topics  . Alcohol use: Yes    Alcohol/week: 2.0 standard drinks    Types: 2 Cans of beer per week    Comment: 2 24 oz daily  . Drug use: Yes    Types: Marijuana    Home Medications Prior to Admission medications   Medication Sig Start Date End Date Taking? Authorizing Provider  omeprazole (PRILOSEC) 20 MG capsule Take 1 capsule (20 mg total) by mouth daily. Patient not taking: No sig reported 09/18/19   Wurst, [place], PA-C  ondansetron (ZOFRAN ODT) 4 MG disintegrating tablet 4mg  ODT q4 hours prn nausea/vomit Patient not taking: Reported on 07/09/2020 06/18/20   08/18/20, MD  ondansetron (ZOFRAN) 4 MG tablet Take 1 tablet (4 mg total) by mouth every 8 (eight) hours as needed for nausea or vomiting. Patient not taking: No sig reported 03/27/20   Sponseller, 03/29/20 R, PA-C  pantoprazole (PROTONIX) 20 MG tablet Take 1 tablet (20 mg  total) by mouth daily. Patient not taking: Reported on 07/09/2020 06/18/20   08/18/20, MD    Allergies    Patient has no known allergies.  Review of Systems   Review of Systems  Musculoskeletal: Positive for arthralgias.  Neurological: Negative for weakness and numbness.    Physical Exam Updated Vital Signs BP 113/83 (BP Location: Left Arm)   Pulse 82   Temp 97.8 F (36.6 C) (Oral)   Resp 14   Ht 5\' 6"  (1.676 m)   Wt 56.7 kg   SpO2 99%   BMI 20.18 kg/m   Physical Exam Vitals and nursing note reviewed.  Constitutional:      Appearance: He is well-developed. He is not ill-appearing or diaphoretic.  HENT:     Head: Normocephalic and atraumatic.  Eyes:     Conjunctiva/sclera: Conjunctivae normal.  Cardiovascular:     Rate and Rhythm: Normal rate and regular rhythm.     Heart sounds: No murmur heard.   Pulmonary:     Effort: Pulmonary effort is normal. No respiratory distress.     Breath sounds: Normal breath sounds.  Abdominal:     Palpations: Abdomen is soft.     Tenderness: There is no abdominal tenderness.  Musculoskeletal:        General: Swelling and tenderness  present. No deformity or signs of injury.     Cervical back: Neck supple.     Left lower leg: Edema present.     Comments: Left lateral malleolus with some mild swelling, no significant bruising, no evidence of deformities, skin tenting, open fractures.  2+ DP pulses, neurovascularly intact.  Skin:    General: Skin is warm and dry.  Neurological:     Mental Status: He is alert.     ED Results / Procedures / Treatments   Labs (all labs ordered are listed, but only abnormal results are displayed) Labs Reviewed - No data to display  EKG None  Radiology DG Ankle Complete Left  Result Date: 07/09/2020 CLINICAL DATA:  Pain following recent rolling injury EXAM: LEFT ANKLE COMPLETE - 3+ VIEW COMPARISON:  None. FINDINGS: Frontal, oblique, and lateral views were obtained. There is a fracture along  the proximal fifth metatarsal with separation of fracture fragments. No other evident fracture. No joint effusion. There is no appreciable joint space narrowing. There is subchondral cystic change in the medial talar dome, however. No erosion. IMPRESSION: Mildly displaced fracture along the proximal fifth metatarsal metaphysis. No other fracture. Ankle mortise appears intact. Subchondral cystic change in the medial talar dome region. Electronically Signed   By: Bretta Bang III M.D.   On: 07/09/2020 12:25   DG Foot Complete Left  Result Date: 07/09/2020 CLINICAL DATA:  Left foot and ankle pain laterally EXAM: LEFT FOOT - COMPLETE 3+ VIEW COMPARISON:  None. FINDINGS: Transverse fracture base of fifth metatarsal compatible with acute fracture without displacement. No other fracture. Moderate to advanced degenerative change in the first MTP IMPRESSION: Nondisplaced fracture base of fifth metatarsal. Electronically Signed   By: Marlan Palau M.D.   On: 07/09/2020 12:53    Procedures Procedures   Medications Ordered in ED Medications - No data to display  ED Course  I have reviewed the triage vital signs and the nursing notes.  Pertinent labs & imaging results that were available during my care of the patient were reviewed by me and considered in my medical decision making (see chart for details).    MDM Rules/Calculators/A&P                          55 year old male with left ankle pain.  Plain films of the left ankle unremarkable, however he was noted to have a nondisplaced fracture of the fifth metatarsal.  No evidence of Lisfranc fracture.  Evidence of open fracture.  No evidence of neurovascular compromise.  Patient was instructed to wear cam walker, crutches, nonweightbearing until seen by orthopedics.  Referral provided.  Encouraged Tylenol or ibuprofen for pain.  RICE protocol.  We discussed return precautions. Final Clinical Impression(s) / ED Diagnoses Final diagnoses:  Closed  physeal fracture of fifth metatarsal bone of left foot, unspecified physeal fracture configuration, initial encounter    Rx / DC Orders ED Discharge Orders    None       Leone Brand 07/09/20 1302    Benjiman Core, MD 07/09/20 1536

## 2020-07-09 NOTE — Discharge Instructions (Addendum)
Your x-ray showed that you have fractured the fifth bone in your left foot.  You will need to wear the cam walker and use crutches.  Do not bear weight on the foot until seen by a orthopedic doctor.  Please follow-up with Dr. Dallas Schimke in 1 week.  You may take Tylenol or ibuprofen for pain.  Elevate the foot, you may apply ice.  Return to the ER for any new or worsening symptoms.

## 2020-07-16 ENCOUNTER — Other Ambulatory Visit: Payer: Self-pay

## 2020-07-16 ENCOUNTER — Ambulatory Visit (INDEPENDENT_AMBULATORY_CARE_PROVIDER_SITE_OTHER): Payer: Self-pay | Admitting: Orthopedic Surgery

## 2020-07-16 ENCOUNTER — Encounter: Payer: Self-pay | Admitting: Orthopedic Surgery

## 2020-07-16 VITALS — BP 121/81 | HR 81 | Ht 66.0 in | Wt 113.0 lb

## 2020-07-16 DIAGNOSIS — S99192A Other physeal fracture of left metatarsal, initial encounter for closed fracture: Secondary | ICD-10-CM

## 2020-07-16 NOTE — Progress Notes (Signed)
New Patient Visit  Assessment: Erik Burns is a 55 y.o. male with the following: Left foot, Jones fracture  Plan: Radiographs were reviewed with the patient in clinic today.  The injury was also discussed in great detail.  He was discharged from the emergency department with a walking boot and told to use crutches.  However, he walked into clinic today.  His pain is improving.  However, he does note worsening pain if he is on his feet for extended periods of time.  As a result, we will allow him to continue with his weightbearing as tolerated.  He is aware that this type of fracture has a tendency not to fully heal, but I think his pain will improve.  His swelling and tenderness is already significantly improved.  He is on his feet a lot at work, and we will keep him out of work for another few days to allow his foot to continue to heal.  Tylenol and ibuprofen as needed.  He is also aware that smoking can interfere with bone healing.  Follow-up in approximately 4 weeks.   Follow-up: Return in about 4 weeks (around 08/13/2020).  Subjective:  Chief Complaint  Patient presents with  . Foot Injury    Lt foot fx DOI 07/06/20    History of Present Illness: Erik Burns is a 55 y.o. male who presents for evaluation of a left foot injury.  Approximately 10 days ago, he rolled his ankle.  He states he was drinking when this happened.  He tried to treated as a sprained ankle, but continues have difficulty walking.  X-rays in the emergency department demonstrated a proximal fifth meta tarsal fracture on the left.  He was placed in a walking boot, and told to use crutches.  However, he has not been using the walking boot, and has been ambulating on his left foot.  He is also returned to work, which requires him to be on his feet all day.  He notes worsening pain if he is on his feet for extended periods of time.  This improves when he gets off his feet, but he still has throbbing.  No other injuries are  noted.  No numbness or tingling   Review of Systems: No fevers or chills No numbness or tingling No chest pain No shortness of breath No bowel or bladder dysfunction No GI distress No headaches   Medical History:  History reviewed. No pertinent past medical history.  History reviewed. No pertinent surgical history.  History reviewed. No pertinent family history. Social History   Tobacco Use  . Smoking status: Current Every Day Smoker    Packs/day: 1.00    Years: 28.00    Pack years: 28.00    Types: Cigarettes  . Smokeless tobacco: Never Used  Vaping Use  . Vaping Use: Never used  Substance Use Topics  . Alcohol use: Yes    Alcohol/week: 2.0 standard drinks    Types: 2 Cans of beer per week    Comment: 2 24 oz daily  . Drug use: Yes    Types: Marijuana    No Known Allergies  No outpatient medications have been marked as taking for the 07/16/20 encounter (Office Visit) with Oliver Barre, MD.    Objective: BP 121/81   Pulse 81   Ht 5\' 6"  (1.676 m)   Wt 113 lb (51.3 kg)   BMI 18.24 kg/m   Physical Exam:  General: Alert and oriented.  No acute distress. Gait:  Left-sided antalgic gait, walking in sandals  Evaluation left foot demonstrates a small, but noticeable bump on the lateral border of his foot.  He does have some tenderness to palpation in this area.  No bruising is appreciated.  Sensation is intact distally.  Toes are warm and well-perfused.  2+ DP pulse.  Normal range of motion at the ankle.    IMAGING: I personally reviewed images previously obtained from the ED   X-rays of the foot demonstrates a proximal left fifth metatarsal fracture.  Minimally displaced.  No callus formation is appreciated.  No other injuries are noted.   New Medications:  No orders of the defined types were placed in this encounter.     Oliver Barre, MD  07/17/2020 9:51 AM

## 2020-07-16 NOTE — Patient Instructions (Signed)
Letter - out of work until 07/21/20  Return without restrictions

## 2020-07-17 ENCOUNTER — Encounter: Payer: Self-pay | Admitting: Orthopedic Surgery

## 2020-07-31 ENCOUNTER — Telehealth: Payer: Self-pay | Admitting: Orthopedic Surgery

## 2020-07-31 NOTE — Telephone Encounter (Signed)
Spoke with patient, regarding forms for short term disability; discussed [employer] form/fee protocol; aware to bring back form with fee.

## 2020-08-12 ENCOUNTER — Encounter: Payer: Self-pay | Admitting: Orthopaedic Surgery

## 2020-08-12 ENCOUNTER — Ambulatory Visit: Payer: Self-pay | Admitting: Orthopedic Surgery

## 2020-08-13 ENCOUNTER — Ambulatory Visit: Payer: Self-pay | Admitting: Orthopedic Surgery

## 2020-08-15 ENCOUNTER — Encounter: Payer: Self-pay | Admitting: Orthopedic Surgery

## 2020-08-22 ENCOUNTER — Encounter: Payer: Self-pay | Admitting: Orthopedic Surgery

## 2020-08-22 ENCOUNTER — Ambulatory Visit (INDEPENDENT_AMBULATORY_CARE_PROVIDER_SITE_OTHER): Payer: Self-pay | Admitting: Orthopedic Surgery

## 2020-08-22 ENCOUNTER — Ambulatory Visit: Payer: Self-pay

## 2020-08-22 ENCOUNTER — Other Ambulatory Visit: Payer: Self-pay

## 2020-08-22 VITALS — Ht 66.0 in | Wt 115.0 lb

## 2020-08-22 DIAGNOSIS — S99192D Other physeal fracture of left metatarsal, subsequent encounter for fracture with routine healing: Secondary | ICD-10-CM

## 2020-08-22 NOTE — Progress Notes (Signed)
Return Visit  Assessment: Erik Burns is a 55 y.o. male with the following: Left foot, Jones fracture  Plan: Repeat radiographs with evidence of healing.  His pain has significantly improved.  He has returned to work.  No restrictions at this time.  He can remain at work.  Occasional pains in his foot will gradually improve.  Contact the clinic if issues. Follow up as needed.    Follow-up: Return if symptoms worsen or fail to improve.  Subjective:  Chief Complaint  Patient presents with   Fracture    Lt foot fracture care DOI 07/06/20    History of Present Illness: Erik Burns is a 55 y.o. male who presents for evaluation of a left foot injury.  He sustained a base of the 5th metatarsal fracture on his left foot approximately 6-7 weeks ago.  His pain is much better.  He has returned to work.  No pain while wearing his steel toe work boots. Occasional sharp pain on lateral border of his foot if he steps incorrectly.  Swelling improved.  He is not taking anything for pain.    Review of Systems: No fevers or chills No numbness or tingling No chest pain No shortness of breath No bowel or bladder dysfunction No GI distress No headaches      Objective: Ht 5\' 6"  (1.676 m)   Wt 115 lb (52.2 kg)   BMI 18.56 kg/m   Physical Exam:  General: Alert and oriented.  No acute distress. Gait: Normal gait in work boots.   Left foot without swelling.  Full and painless ROM.  No tenderness over lateral ankle.  No tenderness at the base of the 5th metatarsal.  Toes are warm and well perfused.     IMAGING: I personally reviewed images previously obtained in clinic   XR of the left foot were obtained in clinic today and demonstrates interval healing at the fracture site.  There has been no further displacement.  No acute injuries.    Impression: healing left 5th metatarsal fracture   New Medications:  No orders of the defined types were placed in this encounter.     , MD  08/22/2020 8:35 AM

## 2020-10-27 ENCOUNTER — Ambulatory Visit (INDEPENDENT_AMBULATORY_CARE_PROVIDER_SITE_OTHER): Payer: Self-pay | Admitting: Gastroenterology

## 2020-10-27 ENCOUNTER — Encounter (INDEPENDENT_AMBULATORY_CARE_PROVIDER_SITE_OTHER): Payer: Self-pay | Admitting: Gastroenterology

## 2021-05-13 ENCOUNTER — Encounter (HOSPITAL_COMMUNITY): Payer: Self-pay

## 2021-05-13 ENCOUNTER — Emergency Department (HOSPITAL_COMMUNITY)
Admission: EM | Admit: 2021-05-13 | Discharge: 2021-05-13 | Disposition: A | Discharge status: Home / Self Care | Payer: Self-pay | Attending: Emergency Medicine | Admitting: Emergency Medicine

## 2021-05-13 ENCOUNTER — Emergency Department (HOSPITAL_COMMUNITY): Payer: Self-pay

## 2021-05-13 ENCOUNTER — Other Ambulatory Visit: Payer: Self-pay

## 2021-05-13 DIAGNOSIS — J029 Acute pharyngitis, unspecified: Secondary | ICD-10-CM | POA: Insufficient documentation

## 2021-05-13 DIAGNOSIS — R490 Dysphonia: Secondary | ICD-10-CM | POA: Insufficient documentation

## 2021-05-13 LAB — CBC WITH DIFFERENTIAL/PLATELET
Abs Immature Granulocytes: 0 10*3/uL (ref 0.00–0.07)
Basophils Absolute: 0 10*3/uL (ref 0.0–0.1)
Basophils Relative: 1 %
Eosinophils Absolute: 0.1 10*3/uL (ref 0.0–0.5)
Eosinophils Relative: 2 %
HCT: 44.5 % (ref 39.0–52.0)
Hemoglobin: 15.6 g/dL (ref 13.0–17.0)
Immature Granulocytes: 0 %
Lymphocytes Relative: 31 %
Lymphs Abs: 1.5 10*3/uL (ref 0.7–4.0)
MCH: 33.3 pg (ref 26.0–34.0)
MCHC: 35.1 g/dL (ref 30.0–36.0)
MCV: 94.9 fL (ref 80.0–100.0)
Monocytes Absolute: 0.7 10*3/uL (ref 0.1–1.0)
Monocytes Relative: 14 %
Neutro Abs: 2.6 10*3/uL (ref 1.7–7.7)
Neutrophils Relative %: 52 %
Platelets: 307 10*3/uL (ref 150–400)
RBC: 4.69 MIL/uL (ref 4.22–5.81)
RDW: 13.4 % (ref 11.5–15.5)
WBC: 4.8 10*3/uL (ref 4.0–10.5)
nRBC: 0 % (ref 0.0–0.2)

## 2021-05-13 LAB — BASIC METABOLIC PANEL
Anion gap: 9 (ref 5–15)
BUN: 10 mg/dL (ref 6–20)
CO2: 24 mmol/L (ref 22–32)
Calcium: 9 mg/dL (ref 8.9–10.3)
Chloride: 99 mmol/L (ref 98–111)
Creatinine, Ser: 0.92 mg/dL (ref 0.61–1.24)
GFR, Estimated: >60 mL/min (ref >60)
Glucose, Bld: 95 mg/dL (ref 70–99)
Potassium: 4.2 mmol/L (ref 3.5–5.1)
Sodium: 132 mmol/L — ABNORMAL LOW (ref 135–145)

## 2021-05-13 MED ORDER — IBUPROFEN 600 MG PO TABS: 600.0000 mg | ORAL_TABLET | Freq: Three times a day (TID) | ORAL | 0 refills | Status: DC

## 2021-05-13 MED ORDER — IOHEXOL 300 MG/ML  SOLN
100.0000 mL | Freq: Once | INTRAMUSCULAR | Status: AC | PRN
  Administered 2021-05-13: 75 mL via INTRAVENOUS

## 2021-05-13 MED ORDER — AMOXICILLIN 500 MG PO CAPS: 500.0000 mg | ORAL_CAPSULE | Freq: Three times a day (TID) | ORAL | 0 refills | Status: DC

## 2021-05-13 NOTE — ED Notes (Signed)
Returned from CT.

## 2021-05-13 NOTE — Discharge Instructions (Addendum)
Your CT scan today shows some moderate inflammation of your throat and your vocal cord region without there being an obvious foreign body at this time, however it is possible there is a retained piece of food that may not show up on today's CT scan. Less likely this is a tumor causing your symptoms, but we are recommending that you have a direct visualization test which an ear nose and throat specialist can perform.  Please call the number listed above for an appointment to have this completed.  In the interim you are being prescribed some medication that hopefully will improve your symptoms. ?

## 2021-05-13 NOTE — ED Provider Notes (Signed)
[facility] EMERGENCY DEPARTMENT Provider Note   CSN: [id number] Arrival date & time: 05/13/21  1610     History  Chief Complaint  Patient presents with   Sore Throat    Erik Burns is a 56 y.o. male with no significant past medical history presenting for evaluation of throat pain and foreign body sensation since eating a pizza 3 days ago.  He describes eating a pepperoni pizza and he felt that a piece of pepperoni got stuck in his throat and he has been unable to cough or washdown this food despite multiple attempts.  He also endorses voice change which he states has gradually occurred over the past 3 days.  He denies shortness of breath, fevers or chills, wheezing.  He is able to tolerate liquids and has been able to eat soft foods, he tried to eat chicken wings earlier today which he ended up vomiting up.  He does report episodes similar to this in the past but he is been able to cough up the food debris for complete resolution.  The history is provided by the patient.      Home Medications Prior to Admission medications   Medication Sig Start Date End Date Taking? Authorizing Provider  amoxicillin (AMOXIL) 500 MG capsule Take 1 capsule (500 mg total) by mouth 3 (three) times daily. 05/13/21  Yes Costantino Kohlbeck, Raynelle Fanning, PA-C  ibuprofen (ADVIL) 600 MG tablet Take 1 tablet (600 mg total) by mouth 3 (three) times daily. 05/13/21  Yes Daemion Mcniel, Raynelle Fanning, PA-C  omeprazole (PRILOSEC) 20 MG capsule Take 1 capsule (20 mg total) by mouth daily. Patient not taking: No sig reported 09/18/19   Wurst, [place], PA-C  ondansetron (ZOFRAN ODT) 4 MG disintegrating tablet 4mg  ODT q4 hours prn nausea/vomit Patient not taking: No sig reported 06/18/20   08/18/20, MD  ondansetron (ZOFRAN) 4 MG tablet Take 1 tablet (4 mg total) by mouth every 8 (eight) hours as needed for nausea or vomiting. Patient not taking: No sig reported 03/27/20   Sponseller, 03/29/20 R, PA-C  pantoprazole (PROTONIX) 20 MG tablet Take 1 tablet (20  mg total) by mouth daily. Patient not taking: No sig reported 06/18/20   08/18/20, MD      Allergies    Patient has no known allergies.    Review of Systems   Review of Systems  Constitutional:  Negative for chills and fever.  HENT:  Positive for trouble swallowing and voice change. Negative for congestion, mouth sores and sore throat.   Eyes: Negative.   Respiratory:  Negative for chest tightness and shortness of breath.   Cardiovascular:  Negative for chest pain.  Gastrointestinal:  Negative for abdominal pain and nausea.  Genitourinary: Negative.   Musculoskeletal:  Negative for arthralgias, joint swelling and neck pain.  Skin: Negative.  Negative for rash and wound.  Neurological:  Negative for dizziness, weakness, light-headedness, numbness and headaches.  Psychiatric/Behavioral: Negative.     Physical Exam Updated Vital Signs BP 124/70 (BP Location: Left Arm)    Pulse 70    Temp 98.4 F (36.9 C) (Oral)    Resp 16    Wt 52.2 kg    SpO2 100%    BMI 18.56 kg/m  Physical Exam Vitals and nursing note reviewed.  Constitutional:      Appearance: He is well-developed.  HENT:     Head: Normocephalic and atraumatic.     Jaw: There is normal jaw occlusion.     Comments: Hoarseness of voice present.  No stridor.  No lymphadenopathy.    Nose: No congestion or rhinorrhea.     Mouth/Throat:     Mouth: Mucous membranes are moist.     Pharynx: Uvula midline. No pharyngeal swelling or posterior oropharyngeal erythema.  Eyes:     Conjunctiva/sclera: Conjunctivae normal.  Cardiovascular:     Rate and Rhythm: Normal rate and regular rhythm.     Heart sounds: Normal heart sounds.  Pulmonary:     Effort: Pulmonary effort is normal.     Breath sounds: Normal breath sounds. No stridor. No wheezing, rhonchi or rales.  Abdominal:     General: There is no distension.     Tenderness: There is no abdominal tenderness.  Musculoskeletal:        General: Normal range of motion.      Cervical back: Normal range of motion.  Skin:    General: Skin is warm and dry.  Neurological:     Mental Status: He is alert.    ED Results / Procedures / Treatments   Labs (all labs ordered are listed, but only abnormal results are displayed) Labs Reviewed  BASIC METABOLIC PANEL - Abnormal; Notable for the following components:      Result Value   Sodium 132 (*)    All other components within normal limits  CBC WITH DIFFERENTIAL/PLATELET    EKG None  Radiology CT Soft Tissue Neck W Contrast  Result Date: 05/13/2021 CLINICAL DATA:  Palate weakness cranial nerve 9. Hoarseness. Feels foreign object in throat. EXAM: CT NECK WITH CONTRAST TECHNIQUE: Multidetector CT imaging of the neck was performed using the standard protocol following the bolus administration of intravenous contrast. RADIATION DOSE REDUCTION: This exam was performed according to the departmental dose-optimization program which includes automated exposure control, adjustment of the mA and/or kV according to patient size and/or use of iterative reconstruction technique. CONTRAST:  32mL OMNIPAQUE IOHEXOL 300 MG/ML  SOLN COMPARISON:  None. FINDINGS: Pharynx and larynx: Mild mucosal thickening of the posterior nasopharynx. This is symmetric and diffuse and without masslike features. The soft palate and uvula appear normal. Tongue base normal. Mild nodularity of the false cords bilaterally which could be due to inflammation. Tumor also possible. Salivary glands: No inflammation, mass, or stone. Thyroid: Negative Lymph nodes: No enlarged lymph nodes in the neck. Vascular: Normal vascular enhancement. Limited intracranial: Negative Visualized orbits: Negative Mastoids and visualized paranasal sinuses: Paranasal sinuses clear. Mastoid clear bilaterally Skeleton: Poor dentition with multiple caries. No acute skeletal abnormality elsewhere. Upper chest: Apical blebs bilaterally right greater than left. No pneumothorax. No infiltrate.  Calcified pleural plaques in the apices. Other: None IMPRESSION: 1. Mucosal edema in the posterior nasopharynx. This is symmetric and diffuse. There is a similar appearance in the false cords. This could be inflammatory or less likely neoplastic. Direct visualization recommended. 2. No definite mass or adenopathy in the neck. Electronically Signed   By: Marlan Palau M.D.   On: 05/13/2021 18:38    Procedures Procedures    Medications Ordered in ED Medications  iohexol (OMNIPAQUE) 300 MG/ML solution 100 mL (75 mLs Intravenous Contrast Given 05/13/21 1800)    ED Course/ Medical Decision Making/ A&P                           Medical Decision Making Patient with difficulty swallowing and increasing hoarseness of voice since he ate pizza 3 days ago and has persistent sensation of retained foreign body in his throat, suspicious for  a piece of pepperoni.  He denies nausea or vomiting and has been able to tolerate p.o. intake although he tried to eat chicken wings yesterday which he vomited up.  Has been able to tolerate all other p.o. intake.  Patient is a smoker.  Differential diagnosis includes viral or bacterial pharyngitis, retained foreign body, laryngitis, mass, esophageal stricture.  Amount and/or Complexity of Data Reviewed Labs: ordered.    Details: ASIC labs are normal. Radiology: ordered.    Details: CT imaging revealing for mucosal edema in the posterior nasopharynx, suspected inflammatory, less likely neoplastic.  Patient is a smoker.  Risk Prescription drug management. Decision regarding hospitalization. Risk Details: Patient was placed on amoxicillin to rule out infection as source of his inflammation.  He was also prescribed ibuprofen.  We discussed the results of his CT imaging and that this is probably a simple inflammation, but also discussed the lesser possibility of this being an early neoplastic condition.  Discussed at length his need to see it ENT specialist for direct  laryngoscopy.  Also unable to rule out retained foreign body on this imaging test, but no obvious obstructing body found.  Referral to [facility] ENT, patient understands to call in the morning for an appointment.           Final Clinical Impression(s) / ED Diagnoses Final diagnoses:  Pharyngitis, unspecified etiology    Rx / DC Orders ED Discharge Orders          Ordered    amoxicillin (AMOXIL) 500 MG capsule  3 times daily        05/13/21 1854    ibuprofen (ADVIL) 600 MG tablet  3 times daily        05/13/21 1854              Burgess Amor, Cordelia Poche 05/13/21 1919    Terrilee Files, MD 05/14/21 [id number]

## 2021-05-13 NOTE — ED Triage Notes (Signed)
Pt reports that he feels like he has something stuck in his throat since Sunday. Pt is able to swallow his saliva and is able to take in oral fluids. ?

## 2021-11-18 ENCOUNTER — Emergency Department (HOSPITAL_COMMUNITY): Payer: Self-pay

## 2021-11-18 ENCOUNTER — Other Ambulatory Visit: Payer: Self-pay

## 2021-11-18 ENCOUNTER — Encounter (HOSPITAL_COMMUNITY): Payer: Self-pay

## 2021-11-18 ENCOUNTER — Emergency Department (HOSPITAL_COMMUNITY)
Admission: EM | Admit: 2021-11-18 | Discharge: 2021-11-18 | Disposition: A | Discharge status: Home / Self Care | Payer: Self-pay | Attending: Emergency Medicine | Admitting: Emergency Medicine

## 2021-11-18 DIAGNOSIS — K2920 Alcoholic gastritis without bleeding: Secondary | ICD-10-CM | POA: Insufficient documentation

## 2021-11-18 LAB — CBC
HCT: 44.1 % (ref 39.0–52.0)
Hemoglobin: 15.5 g/dL (ref 13.0–17.0)
MCH: 32.3 pg (ref 26.0–34.0)
MCHC: 35.1 g/dL (ref 30.0–36.0)
MCV: 91.9 fL (ref 80.0–100.0)
Platelets: 229 10*3/uL (ref 150–400)
RBC: 4.8 MIL/uL (ref 4.22–5.81)
RDW: 14.4 % (ref 11.5–15.5)
WBC: 5.1 10*3/uL (ref 4.0–10.5)
nRBC: 0 % (ref 0.0–0.2)

## 2021-11-18 LAB — COMPREHENSIVE METABOLIC PANEL
ALT: 17 U/L (ref 0–44)
AST: 26 U/L (ref 15–41)
Albumin: 4.5 g/dL (ref 3.5–5.0)
Alkaline Phosphatase: 54 U/L (ref 38–126)
Anion gap: 8 (ref 5–15)
BUN: 6 mg/dL (ref 6–20)
CO2: 25 mmol/L (ref 22–32)
Calcium: 9.3 mg/dL (ref 8.9–10.3)
Chloride: 97 mmol/L — ABNORMAL LOW (ref 98–111)
Creatinine, Ser: 0.92 mg/dL (ref 0.61–1.24)
GFR, Estimated: >60 mL/min (ref >60)
Glucose, Bld: 78 mg/dL (ref 70–99)
Potassium: 3.7 mmol/L (ref 3.5–5.1)
Sodium: 130 mmol/L — ABNORMAL LOW (ref 135–145)
Total Bilirubin: 0.8 mg/dL (ref 0.3–1.2)
Total Protein: 8.1 g/dL (ref 6.5–8.1)

## 2021-11-18 LAB — LIPASE, BLOOD: Lipase: 25 U/L (ref 11–51)

## 2021-11-18 MED ORDER — PANTOPRAZOLE SODIUM 40 MG IV SOLR
40.0000 mg | INTRAVENOUS | Status: AC
  Administered 2021-11-18: 40 mg via INTRAVENOUS
  Filled 2021-11-18: qty 10

## 2021-11-18 MED ORDER — PANTOPRAZOLE SODIUM 40 MG PO TBEC: 40.0000 mg | DELAYED_RELEASE_TABLET | Freq: Every day | ORAL | 1 refills | Status: AC

## 2021-11-18 MED ORDER — IOHEXOL 300 MG/ML  SOLN
100.0000 mL | Freq: Once | INTRAMUSCULAR | Status: AC | PRN
  Administered 2021-11-18: 100 mL via INTRAVENOUS

## 2021-11-18 NOTE — ED Provider Triage Note (Signed)
Emergency Medicine Provider Triage Evaluation Note  KELVYN SCHUNK , a 56 y.o. male  was evaluated in triage.  Pt complains of epigastric abdominal pain ongoing for about a week intermittently that worsened while patient was at work today.  Denies fever, chills, nausea, vomiting, diarrhea, constipation.  Endorses lack of appetite for the past 3 days.  Denies history of pancreatitis.  Endorses alcohol use daily.  Review of Systems  Positive: As above Negative: As above  Physical Exam  BP 132/84 (BP Location: Right Arm)   Pulse 78   Temp 98.3 F (36.8 C)   Resp 20   Ht 5' 6.5" (1.689 m)   Wt 53.5 kg   SpO2 99%   BMI 18.76 kg/m  Gen:   Awake, no distress   Resp:  Normal effort  MSK:   Moves extremities without difficulty  Other:    Medical Decision Making  Medically screening exam initiated at 7:05 PM.  Appropriate orders placed.  KIP CROPP was informed that the remainder of the evaluation will be completed by another provider, this initial triage assessment does not replace that evaluation, and the importance of remaining in the ED until their evaluation is complete.     Marita [place], PA-C 11/19/21 2359

## 2021-11-18 NOTE — ED Provider Notes (Signed)
[facility] EMERGENCY DEPARTMENT Provider Note   CSN: [id number] Arrival date & time: 11/18/21  1850     History  Chief Complaint  Patient presents with   Abdominal Pain    Erik Burns is a 56 y.o. male.   Abdominal Pain  This patient is a 57 year old male with a history of fairly heavy alcohol use.  He states that he drinks every single day at least 4 beers but on the weekends he drinks a lot more.  He states that over the last week he has had a progressive mild discomfort in the mid abdomen with an associated loss of appetite.  He correlates this with more alcohol that he drinks the less he wants to eat.  He has not had any vomiting diarrhea or dysuria, he has no back pain chest pain coughing or shortness of breath.  He is taken no medication for this abdominal pain    Home Medications Prior to Admission medications   Medication Sig Start Date End Date Taking? Authorizing Provider  pantoprazole (PROTONIX) 40 MG tablet Take 1 tablet (40 mg total) by mouth daily. 11/18/21 01/17/22 Yes Eber Hong, MD      Allergies    Patient has no known allergies.    Review of Systems   Review of Systems  Gastrointestinal:  Positive for abdominal pain.  All other systems reviewed and are negative.   Physical Exam Updated Vital Signs BP 132/84 (BP Location: Right Arm)   Pulse 78   Temp 98.3 F (36.8 C)   Resp 20   Ht 1.689 m (5' 6.5")   Wt 53.5 kg   SpO2 99%   BMI 18.76 kg/m  Physical Exam Vitals and nursing note reviewed.  Constitutional:      General: He is not in acute distress.    Appearance: He is well-developed.  HENT:     Head: Normocephalic and atraumatic.     Mouth/Throat:     Pharynx: No oropharyngeal exudate.  Eyes:     General: No scleral icterus.       Right eye: No discharge.        Left eye: No discharge.     Conjunctiva/sclera: Conjunctivae normal.     Pupils: Pupils are equal, round, and reactive to light.  Neck:     Thyroid: No thyromegaly.      Vascular: No JVD.  Cardiovascular:     Rate and Rhythm: Normal rate and regular rhythm.     Heart sounds: Normal heart sounds. No murmur heard.    No friction rub. No gallop.  Pulmonary:     Effort: Pulmonary effort is normal. No respiratory distress.     Breath sounds: Normal breath sounds. No wheezing or rales.  Abdominal:     General: Bowel sounds are normal. There is no distension.     Palpations: Abdomen is soft. There is no mass.     Tenderness: There is abdominal tenderness.     Comments: Minimal mid abdominal tenderness but no guarding or peritoneal signs.  No tympanitic sounds to percussion, normal bowel sounds  Musculoskeletal:        General: No tenderness. Normal range of motion.     Cervical back: Normal range of motion and neck supple.     Right lower leg: No edema.     Left lower leg: No edema.  Lymphadenopathy:     Cervical: No cervical adenopathy.  Skin:    General: Skin is warm and dry.  Findings: No erythema or rash.  Neurological:     General: No focal deficit present.     Mental Status: He is alert.     Coordination: Coordination normal.  Psychiatric:        Behavior: Behavior normal.     ED Results / Procedures / Treatments   Labs (all labs ordered are listed, but only abnormal results are displayed) Labs Reviewed  COMPREHENSIVE METABOLIC PANEL - Abnormal; Notable for the following components:      Result Value   Sodium 130 (*)    Chloride 97 (*)    All other components within normal limits  LIPASE, BLOOD  CBC    EKG None  Radiology CT ABDOMEN PELVIS W CONTRAST  Result Date: 11/18/2021 CLINICAL DATA:  Abdominal pain, acute, nonlocalized EXAM: CT ABDOMEN AND PELVIS WITH CONTRAST TECHNIQUE: Multidetector CT imaging of the abdomen and pelvis was performed using the standard protocol following bolus administration of intravenous contrast. RADIATION DOSE REDUCTION: This exam was performed according to the departmental dose-optimization program  which includes automated exposure control, adjustment of the mA and/or kV according to patient size and/or use of iterative reconstruction technique. CONTRAST:  OMNIPAQUE IOHEXOL 300 MG/ML  SOLN COMPARISON:  None Available. FINDINGS: Lower chest: 5 mm noncalcified pulmonary nodule right lower lobe, axial image # 9/4, indeterminate. Visualized lung bases are otherwise clear. Visualized heart and pericardium are unremarkable. Hepatobiliary: Mild hepatic steatosis. No enhancing intrahepatic mass. No intra or extrahepatic biliary ductal dilation. Gallbladder unremarkable. Pancreas: Unremarkable Spleen: Unremarkable Adrenals/Urinary Tract: Adrenal glands are unremarkable. Kidneys are normal, without renal calculi, focal lesion, or hydronephrosis. Bladder is unremarkable. Stomach/Bowel: Stomach is within normal limits. Appendix appears normal. No evidence of bowel wall thickening, distention, or inflammatory changes. Vascular/Lymphatic: Aortic atherosclerosis. No enlarged abdominal or pelvic lymph nodes. Reproductive: Prostate is unremarkable. Other: No abdominal wall hernia or abnormality. No abdominopelvic ascites. Musculoskeletal: No acute or significant osseous findings. IMPRESSION: 1. No acute intra-abdominal pathology identified. No definite radiographic explanation for the patient's reported symptoms. 2. Mild hepatic steatosis. 3. 5 mm right solid pulmonary nodule. Per Fleischner Society Guidelines, no routine follow-up imaging is recommended. These guidelines do not apply to immunocompromised patients and patients with cancer. Follow up in patients with significant comorbidities as clinically warranted. For lung cancer screening, adhere to Lung-RADS guidelines. Reference: Radiology. 2017; 284(1):228-43. Aortic Atherosclerosis (ICD10-I70.0). Electronically Signed   By: Helyn Numbers M.D.   On: 11/18/2021 21:37    Procedures Procedures    Medications Ordered in ED Medications  pantoprazole (PROTONIX)  injection 40 mg (has no administration in time range)  iohexol (OMNIPAQUE) 300 MG/ML solution 100 mL (100 mLs Intravenous Contrast Given 11/18/21 2118)    ED Course/ Medical Decision Making/ A&P                           Medical Decision Making Amount and/or Complexity of Data Reviewed Labs: ordered.  Risk Prescription drug management.   This patient presents to the ED for concern of abdominal pain, this involves an extensive number of treatment options, and is a complaint that carries with it a high risk of complications and morbidity.  The differential diagnosis includes pancreatitis, cholecystitis, bowel obstruction, peptic ulcer disease, alcoholic gastritis   Co morbidities that complicate the patient evaluation  Heavy alcohol use   Additional history obtained:  Additional history obtained from electronic medical record External records from outside source obtained and reviewed including multiple ER visits over time,  no record of office visits until recently being seen by orthopedics in the office.   Lab Tests:  I Ordered, and personally interpreted labs.  The pertinent results include: CBC metabolic panel and lipase all of which are unremarkable   Imaging Studies ordered:  I ordered imaging studies including see the scan of the abdomen and pelvis with contrast I independently visualized and interpreted imaging which showed no acute findings to explain the patient's pain I agree with the radiologist interpretation   Cardiac Monitoring: / EKG:  The patient was maintained on a cardiac monitor.  I personally viewed and interpreted the cardiac monitored which showed an underlying rhythm of: Normal sinus rhythm   Consultations Obtained:  Not indicated   Problem List / ED Course / Critical interventions / Medication management  The patient has abdominal discomfort, heavy drinker, suspect alcoholic gastritis or peptic ulcer disease, medications will be started I  ordered medication including pantoprazole intravenously for gastritis Reevaluation of the patient after these medicines showed that the patient improved, this medication will be prescribed for home I have reviewed the patients home medicines and have made adjustments as needed   Social Determinants of Health:  Heavy alcohol use, patient counseled   Test / Admission - Considered:  Considered admission but the patient has no surgical pathology or need for inpatient stabilizing care         Final Clinical Impression(s) / ED Diagnoses Final diagnoses:  Acute alcoholic gastritis without hemorrhage    Rx / DC Orders ED Discharge Orders          Ordered    pantoprazole (PROTONIX) 40 MG tablet  Daily        11/18/21 2148              Eber Hong, MD 11/18/21 2149

## 2021-11-18 NOTE — Discharge Instructions (Signed)
If you do not have a family doctor please see the list below  Your testing does not show any specific abnormalities.  I suspect that your abdominal pain is related to your heavy alcohol use and thus I would encourage you to please reduce the amount of alcohol that you drink daily until you are able to stop drinking.  Do not go cold [place] as this can cause serious complications.  I do want you to start taking pantoprazole once a day to help with your symptoms  [facility] Doctor List    Syliva Overman, MD. Specialty: [facility] Contact information: [address]  [place] [address]  [phone]   Lilyan Punt, MD. Specialty: [facility] Contact information: [address]  [place] [address]  [phone]   Avon Gully, MD Specialty: Internal Medicine Contact information: [address] [place] [address]  [phone]   Catalina Pizza, MD. Specialty: Internal Medicine Contact information: [address]  [place] [address]  [phone]    [facility] (Dr. Selena Batten) Specialty: Family Medicine Contact information: [address]  [place] [address]  [phone]   John Giovanni, MD. Specialty: [facility] Contact information: [address]  [address]  [place] [address]  [phone]   Carylon Perches, MD. Specialty: Internal Medicine Contact information: [address]  [address]  [place] [address]  [phone]    [facility]  [address] [place], [place] [address] [phone]  Services [facility] offers a variety of basic health services.  Services include but are not limited to: Blood pressure checks  Heart rate checks  Blood sugar checks  Urine analysis  Rapid strep tests  Pregnancy tests.  Health education and referrals  People needing more complex services will be directed to a physician  online. Using these virtual visits, doctors can evaluate and prescribe medicine and treatments. There will be no medication on-site, though [facility] will help patients fill their prescriptions at little to no cost.   For More information please go to: [email or web address]

## 2021-11-18 NOTE — ED Triage Notes (Signed)
Pt to er, pt c/o abd pain, pt states that he has been having the pain for the past week. States that he hasn't had anything to eat for the past three days, denies vomiting or diarrhea.
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
## 2023-09-05 ENCOUNTER — Ambulatory Visit: Admitting: Physician Assistant

## 2023-09-23 ENCOUNTER — Ambulatory Visit
Admission: EM | Admit: 2023-09-23 | Discharge: 2023-09-23 | Disposition: A | Discharge status: Home / Self Care | Attending: Family Medicine | Admitting: Family Medicine

## 2023-09-23 DIAGNOSIS — L03113 Cellulitis of right upper limb: Secondary | ICD-10-CM

## 2023-09-23 DIAGNOSIS — T22211A Burn of second degree of right forearm, initial encounter: Secondary | ICD-10-CM | POA: Diagnosis not present

## 2023-09-23 MED ORDER — SILVER SULFADIAZINE 1 % EX CREA: TOPICAL_CREAM | Freq: Once | CUTANEOUS | Status: AC

## 2023-09-23 MED ORDER — CEPHALEXIN 500 MG PO CAPS: 500.0000 mg | ORAL_CAPSULE | Freq: Two times a day (BID) | ORAL | 0 refills | Status: DC

## 2023-09-23 MED ORDER — CHLORHEXIDINE GLUCONATE 4 % EX SOLN: Freq: Every day | CUTANEOUS | 0 refills | Status: AC | PRN

## 2023-09-23 MED ORDER — SILVER SULFADIAZINE 1 % EX CREA: 1.0000 | TOPICAL_CREAM | Freq: Every day | CUTANEOUS | 0 refills | Status: AC

## 2023-09-23 NOTE — Discharge Instructions (Signed)
 Clean the area once to twice daily with the Hibiclens  solution, apply the Silvadene  cream and a nonstick gauze pad, otherwise known as a Telfa pad.  Wrap Coban around the area to secure in place.  Take the course of antibiotics and follow-up for worsening or unresolving symptoms.  Note that burns take quite some time to fully heal, continue this wound care and dressings until fully healed.
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
## 2023-11-28 ENCOUNTER — Encounter (HOSPITAL_COMMUNITY): Payer: Self-pay | Admitting: Emergency Medicine

## 2023-11-28 ENCOUNTER — Other Ambulatory Visit: Payer: Self-pay

## 2023-11-28 ENCOUNTER — Emergency Department (HOSPITAL_COMMUNITY)

## 2023-11-28 ENCOUNTER — Emergency Department (HOSPITAL_COMMUNITY)
Admission: EM | Admit: 2023-11-28 | Discharge: 2023-11-28 | Disposition: A | Discharge status: Home / Self Care | Attending: Emergency Medicine | Admitting: Emergency Medicine

## 2023-11-28 DIAGNOSIS — Z720 Tobacco use: Secondary | ICD-10-CM | POA: Diagnosis not present

## 2023-11-28 DIAGNOSIS — J181 Lobar pneumonia, unspecified organism: Secondary | ICD-10-CM | POA: Diagnosis not present

## 2023-11-28 DIAGNOSIS — J189 Pneumonia, unspecified organism: Secondary | ICD-10-CM

## 2023-11-28 DIAGNOSIS — R059 Cough, unspecified: Secondary | ICD-10-CM | POA: Diagnosis present

## 2023-11-28 LAB — CBC
HCT: 50 % (ref 39.0–52.0)
Hemoglobin: 17.1 g/dL — ABNORMAL HIGH (ref 13.0–17.0)
MCH: 32.9 pg (ref 26.0–34.0)
MCHC: 34.2 g/dL (ref 30.0–36.0)
MCV: 96.2 fL (ref 80.0–100.0)
Platelets: 256 K/uL (ref 150–400)
RBC: 5.2 MIL/uL (ref 4.22–5.81)
RDW: 14.1 % (ref 11.5–15.5)
WBC: 7.8 K/uL (ref 4.0–10.5)
nRBC: 0 % (ref 0.0–0.2)

## 2023-11-28 LAB — BASIC METABOLIC PANEL WITH GFR
Anion gap: 14 (ref 5–15)
BUN: 6 mg/dL (ref 6–20)
CO2: 23 mmol/L (ref 22–32)
Calcium: 9.3 mg/dL (ref 8.9–10.3)
Chloride: 99 mmol/L (ref 98–111)
Creatinine, Ser: 1.22 mg/dL (ref 0.61–1.24)
GFR, Estimated: >60 mL/min (ref >60)
Glucose, Bld: 106 mg/dL — ABNORMAL HIGH (ref 70–99)
Potassium: 4.7 mmol/L (ref 3.5–5.1)
Sodium: 136 mmol/L (ref 135–145)

## 2023-11-28 LAB — TROPONIN I (HIGH SENSITIVITY)
Troponin I (High Sensitivity): 4 ng/L (ref <18)
Troponin I (High Sensitivity): 4 ng/L (ref <18)

## 2023-11-28 LAB — RESP PANEL BY RT-PCR (RSV, FLU A&B, COVID)  RVPGX2
Influenza A by PCR: NEGATIVE
Influenza B by PCR: NEGATIVE
Resp Syncytial Virus by PCR: NEGATIVE
SARS Coronavirus 2 by RT PCR: NEGATIVE

## 2023-11-28 MED ORDER — DOXYCYCLINE HYCLATE 100 MG PO CAPS: 100.0000 mg | ORAL_CAPSULE | Freq: Two times a day (BID) | ORAL | 0 refills | Status: AC

## 2023-11-28 MED ORDER — AMOXICILLIN-POT CLAVULANATE 875-125 MG PO TABS
1.0000 | ORAL_TABLET | Freq: Once | ORAL | Status: AC
  Administered 2023-11-28: 1 via ORAL
  Filled 2023-11-28: qty 1

## 2023-11-28 MED ORDER — DOXYCYCLINE HYCLATE 100 MG PO TABS
100.0000 mg | ORAL_TABLET | Freq: Once | ORAL | Status: AC
  Administered 2023-11-28: 100 mg via ORAL
  Filled 2023-11-28: qty 1

## 2023-11-28 MED ORDER — AMOXICILLIN-POT CLAVULANATE 875-125 MG PO TABS: 1.0000 | ORAL_TABLET | Freq: Two times a day (BID) | ORAL | 0 refills | Status: AC

## 2023-11-28 MED ORDER — KETOROLAC TROMETHAMINE 15 MG/ML IJ SOLN
15.0000 mg | Freq: Once | INTRAMUSCULAR | Status: AC
  Administered 2023-11-28: 15 mg via INTRAVENOUS
  Filled 2023-11-28: qty 1

## 2023-11-28 MED ORDER — IOHEXOL 350 MG/ML SOLN
75.0000 mL | Freq: Once | INTRAVENOUS | Status: AC | PRN
  Administered 2023-11-28: 75 mL via INTRAVENOUS

## 2023-11-28 NOTE — Discharge Instructions (Addendum)
 The CT scan of your chest shows evidence of pneumonia.  Start Augmentin , 1 tablet by mouth twice daily for 5 days.  Start doxycycline , 1 tablet by mouth twice daily for 5 days.  You received the first dose of these antibiotics in the emergency department this evening.  It is recommended that you have a repeat CT scan of your chest after completion of these antibiotics to ensure that this has cleared.  Return to the emergency department if your symptoms worsen.  Continue Tylenol /ibuprofen  as needed for pain or fever.  I have provided you with the contact information for [facility], please contact their office to schedule follow-up to establish care since you do not have a primary care provider.

## 2023-11-28 NOTE — ED Provider Notes (Signed)
  EMERGENCY DEPARTMENT AT [facility] Provider Note   CSN: [id number] Arrival date & time: 11/28/23  1615     Patient presents with: Chest Pain   Erik Burns is a 58 y.o. male.   58 year old male presenting with chest pain.  Patient reports that symptoms woke him up out of his sleep yesterday around 3 AM, describes a left-sided chest pain without radiation that is like someone's beat me in the chest.  He also feels chest congestion and the need to cough but reports that his symptoms are worse when he tries to do so.  He notes that his chest pain is worsened with certain positional changes.  Denies fever, lower extremity edema.   Chest Pain      Prior to Admission medications   Medication Sig Start Date End Date Taking? Authorizing Provider  cephALEXin  (KEFLEX ) 500 MG capsule Take 1 capsule (500 mg total) by mouth 2 (two) times daily. 09/23/23   Stuart Vernell Norris, PA-C  chlorhexidine  (HIBICLENS ) 4 % external liquid Apply topically daily as needed. 09/23/23   Stuart Vernell Norris, PA-C  pantoprazole  (PROTONIX ) 40 MG tablet Take 1 tablet (40 mg total) by mouth daily. 11/18/21 01/17/22  Cleotilde Rogue, MD  silver  sulfADIAZINE  (SILVADENE ) 1 % cream Apply 1 Application topically daily. 09/23/23   Stuart Vernell Norris, PA-C    Allergies: Patient has no known allergies.    Review of Systems  Cardiovascular:  Positive for chest pain.    Updated Vital Signs  Vitals:   11/28/23 1634 11/28/23 1635 11/28/23 2006 11/28/23 2145  BP: 115/73   120/72  Pulse: 80   78  Resp: 18   18  Temp: 98.3 F (36.8 C)   98.2 F (36.8 C)  TempSrc: Oral   Oral  SpO2: 99%  99% 99%  Weight:  56.7 kg    Height:  5' 6 (1.676 m)       Physical Exam Vitals and nursing note reviewed.  HENT:     Head: Normocephalic.  Eyes:     Extraocular Movements: Extraocular movements intact.  Cardiovascular:     Rate and Rhythm: Normal rate and regular rhythm.  Pulmonary:     Effort:  Pulmonary effort is normal. No respiratory distress.     Comments: Coarse breath sounds throughout Musculoskeletal:     Cervical back: Normal range of motion.     Comments: Moves all extremities spontaneously without difficulty  Skin:    General: Skin is warm and dry.  Neurological:     Mental Status: He is alert and oriented to person, place, and time.     (all labs ordered are listed, but only abnormal results are displayed) Labs Reviewed  BASIC METABOLIC PANEL WITH GFR - Abnormal; Notable for the following components:      Result Value   Glucose, Bld 106 (*)    All other components within normal limits  CBC - Abnormal; Notable for the following components:   Hemoglobin 17.1 (*)    All other components within normal limits  RESP PANEL BY RT-PCR (RSV, FLU A&B, COVID)  RVPGX2  TROPONIN I (HIGH SENSITIVITY)  TROPONIN I (HIGH SENSITIVITY)    EKG: None  Radiology: CT Angio Chest PE W and/or Wo Contrast Addendum Date: 11/28/2023 ADDENDUM REPORT: 11/28/2023 21:59 ADDENDUM: Also noted is a recent-appearing if not acute, slightly displaced transverse fracture of the left anterior third rib. The ribcage is otherwise intact. Electronically Signed   By: Francis Beatriz HERO.D.  On: 11/28/2023 21:59   Result Date: 11/28/2023 CLINICAL DATA:  Chest pain onset 2 nights ago. Pulmonary embolism suspected. EXAM: CT ANGIOGRAPHY CHEST WITH CONTRAST TECHNIQUE: Multidetector CT imaging of the chest was performed using the standard protocol during bolus administration of intravenous contrast. Multiplanar CT image reconstructions and MIPs were obtained to evaluate the vascular anatomy. RADIATION DOSE REDUCTION: This exam was performed according to the departmental dose-optimization program which includes automated exposure control, adjustment of the mA and/or kV according to patient size and/or use of iterative reconstruction technique. CONTRAST:  75mL OMNIPAQUE  IOHEXOL  350 MG/ML SOLN COMPARISON:  PA Lat  chest today, CT abdomen pelvis with contrast 11/18/2021. FINDINGS: Cardiovascular: Satisfactory opacification of the pulmonary arteries to the segmental level. No evidence of pulmonary embolism. Normal heart size. No pericardial effusion. There are no visible coronary calcifications. There are trace calcific plaques in the aortic isthmus. No aortic or great vessel aneurysm, stenosis or dissection. There is a two-vessel aortic arch with normal variant brachiobicarotid trunk. Mediastinum/Nodes: No enlarged mediastinal, hilar, or axillary lymph nodes. Thyroid gland, trachea, and esophagus demonstrate no significant findings. There is mild elevation of the right hemidiaphragm. Lungs/Pleura: There is mild fluid in the right main bronchus, with fluid and debris noted in the bronchus intermedius, right lower lobe main bronchus, and right middle and lower lobe segmental bronchi as well as several downstream subsegmental bronchi, findings most likely due to aspiration. There is patchy airspace disease and volume loss in the right middle lobe most likely a combination of pneumonia and atelectasis. There are no other focal infiltrates. The lungs are mildly emphysematous with centrilobular changes predominating in the upper lobes, bullous changes in the right upper lobe, with linear scarring in the right apex. There are multiple bilateral noncalcified pleural plaques, and calcified pleural plaquing anteriorly in the upper right thorax corresponding to a calcification in the right upper lobe on the chest x-ray from today, as well as partially calcified pleural plaquing in the anterior right lower chest. Nodular pleural-based disease is noted along the domes of the diaphragm. There is no pleural effusion. Findings consistent with asbestos exposure. There are scattered tiny nodules along both major fissures. Upper Abdomen: No acute abnormality. Musculoskeletal: No chest wall abnormality. No acute or significant osseous findings.  Review of the MIP images confirms the above findings. IMPRESSION: 1. No evidence of arterial dilatation or embolus. 2. Fluid in the right main bronchus, bronchus intermedius, right lower lobe main bronchus, and right middle and lower lobe segmental bronchi as well as several downstream subsegmental bronchi, most likely due to aspiration. Aspiration precautions recommended. 3. Patchy airspace disease and volume loss in the right middle lobe most likely a combination of pneumonia and atelectasis. Follow-up CT recommended after treatment to ensure clearing. 4. 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 5. Emphysema. 6. Minimal aortic atherosclerosis. Aortic Atherosclerosis (ICD10-I70.0) and Emphysema (ICD10-J43.9). Electronically Signed: By: Francis Quam M.D. On: 11/28/2023 20:41   DG Chest 2 View Result Date: 11/28/2023 EXAM: 2 VIEW(S) XRAY OF THE CHEST 11/28/2023 04:47:00 PM COMPARISON: None available. CLINICAL HISTORY: Chest Pain. Per chart: Pov c/o of left sided chest pain, sob since Saturday night. Pt states pain woke pt up suddenly in sleep Saturday and cp has not left. Pt denies radiation of pain. FINDINGS: LUNGS AND PLEURA: Linear atelectasis versus scarring in right middle lobe. 1.3 cm nodular opacity in right upper lobe. No pulmonary edema. No pleural effusion. No pneumothorax. HEART AND MEDIASTINUM: No acute abnormality of the cardiac and mediastinal  silhouettes. BONES AND SOFT TISSUES: Chronic remodeling of left ninth and tenth ribs. No acute osseous abnormality. IMPRESSION: 1. Scarring versus atelectasis in the right middle lobe. 2. 1.3 cm nodular opacity in the right upper lobe is similar to 2022. Electronically signed by: Donnice Mania MD 11/28/2023 05:14 PM EDT RP Workstation: [id number]     Procedures   Medications Ordered in the ED  iohexol  (OMNIPAQUE ) 350 MG/ML injection 75 mL (75 mLs Intravenous Contrast Given 11/28/23 1945)  ketorolac  (TORADOL ) 15 MG/ML injection  15 mg (15 mg Intravenous Given 11/28/23 2139)  amoxicillin -clavulanate (AUGMENTIN ) 875-125 MG per tablet 1 tablet (1 tablet Oral Given 11/28/23 2139)  doxycycline  (VIBRA -TABS) tablet 100 mg (100 mg Oral Given 11/28/23 2139)                                    Medical Decision Making This patient presents to the ED for concern of cough, chest pain, this involves an extensive number of treatment options, and is a complaint that carries with it a high risk of complications and morbidity.  The differential diagnosis includes ACS, pneumonia, pulmonary embolism, COVID/flu/RSV   Additional history obtained:  Additional history obtained from record review External records from outside source obtained and reviewed including recent urgent care note   Lab Tests:  I Ordered, and personally interpreted labs.  The pertinent results include: CBC notable for borderline elevation in hemoglobin at 17.1, otherwise unremarkable.  BMP unremarkable.  Initial troponin of 4, repeat remains unchanged.   Imaging Studies ordered:  I ordered imaging studies including CXR, CT PE study I independently visualized and interpreted imaging which showed  - CXR: 1. Scarring versus atelectasis in the right middle lobe. 2. 1.3 cm nodular opacity in the right upper lobe is similar to 2022. - CT PE study: 1. No evidence of arterial dilatation or embolus. 2. Fluid in the right main bronchus, bronchus intermedius, right lower lobe main bronchus, and right middle and lower lobe segmental bronchi as well as several downstream subsegmental bronchi, most likely due to aspiration. Aspiration precautions recommended. 3. Patchy airspace disease and volume loss in the right middle lobe most likely a combination of pneumonia and atelectasis. Follow-up CT recommended after treatment to ensure clearing. 4. 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 5. Emphysema. 6. Minimal aortic atherosclerosis.  I agree with the  radiologist interpretation   Cardiac Monitoring: / EKG:  The patient was maintained on a cardiac monitor.  I personally viewed and interpreted the cardiac monitored which showed an underlying rhythm of: NSR   Problem List / ED Course / Critical interventions / Medication management  I ordered medication including Toradol  for pain, Augmentin /doxycycline  for suspected aspiration pneumonia I have reviewed the patients home medicines and have made adjustments as needed   Social Determinants of Health:  Tobacco use   Test / Admission - Considered:  Physical exam notable as above, coarse breath sounds noted throughout, no signs of respiratory distress, patient is afebrile and well-appearing, maintains his oxygen saturation on room air without difficulty.  CT findings are suggestive of aspiration pneumonia, CBC without leukocytosis.  Cardiac workup reassuring, low suspicion for ACS based on these findings.  Patient given first dose of antibiotics in the emergency department today, will prescribe course of Augmentin /doxycycline  to be completed outpatient.  Strict return precautions discussed, patient does not have a local primary care provider, I have provided him with the contact information  for [facility] to establish care.  He is in agreement with this plan and is appropriate for discharge at this time.    Amount and/or Complexity of Data Reviewed Labs: ordered. Radiology: ordered.  Risk Prescription drug management.        Final diagnoses:  Pneumonia of right middle lobe due to infectious organism    ED Discharge Orders          Ordered    amoxicillin -clavulanate (AUGMENTIN ) 875-125 MG tablet  Every 12 hours        11/28/23 2136    doxycycline  (VIBRAMYCIN ) 100 MG capsule  2 times daily        11/28/23 2136               Glendia Rocky SAILOR, PA-C 11/28/23 2237    Elnor Jayson LABOR, DO 12/07/23 [phone]

## 2023-11-28 NOTE — ED Triage Notes (Signed)
 Pov c/o of left sided chest pain,  sob since Saturday night. Pt states pain woke pt up suddenly in sleep Saturday and cp has not left. Pt denies radiation of pain.

## 2024-02-05 ENCOUNTER — Emergency Department (HOSPITAL_COMMUNITY)
Admission: EM | Admit: 2024-02-05 | Discharge: 2024-02-05 | Disposition: A | Discharge status: Home / Self Care | Attending: Emergency Medicine | Admitting: Emergency Medicine

## 2024-02-05 ENCOUNTER — Encounter (HOSPITAL_COMMUNITY): Payer: Self-pay | Admitting: *Deleted

## 2024-02-05 ENCOUNTER — Other Ambulatory Visit: Payer: Self-pay

## 2024-02-05 DIAGNOSIS — R220 Localized swelling, mass and lump, head: Secondary | ICD-10-CM | POA: Diagnosis present

## 2024-02-05 DIAGNOSIS — H00034 Abscess of left upper eyelid: Secondary | ICD-10-CM | POA: Insufficient documentation

## 2024-02-05 DIAGNOSIS — L729 Follicular cyst of the skin and subcutaneous tissue, unspecified: Secondary | ICD-10-CM | POA: Diagnosis not present

## 2024-02-05 DIAGNOSIS — D367 Benign neoplasm of other specified sites: Secondary | ICD-10-CM

## 2024-02-05 MED ORDER — DOXYCYCLINE HYCLATE 100 MG PO CAPS: 100.0000 mg | ORAL_CAPSULE | Freq: Two times a day (BID) | ORAL | 0 refills | Status: AC

## 2024-02-05 NOTE — ED Provider Notes (Signed)
 [place] EMERGENCY DEPARTMENT AT [facility] Provider Note   CSN: [id number] Arrival date & time: 02/05/24  1432     Patient presents with: Eye Pain   Erik Burns is a 58 y.o. male.    Eye Pain  This patient is a 58 year old male, he has no significant prior medical history, he presents to the hospital today with complaint of left eye swelling, the patient reports that he has had some swollen areas under his left eye inferolaterally on the cheek and periorbital tissues ongoing for 6 months, these are nontender and appear to be cysts, they are very mobile.  He has had about 1 month of a red swollen area in the left upper eyelid near the eyebrow which is becoming more tender.  He has no drainage onto the eye, no fevers or chills, he states that it did drain some pus 1 time but then it filled back up.     Prior to Admission medications   Medication Sig Start Date End Date Taking? Authorizing Provider  doxycycline  (VIBRAMYCIN ) 100 MG capsule Take 1 capsule (100 mg total) by mouth 2 (two) times daily. 02/05/24  Yes Cleotilde Rogue, MD  chlorhexidine  (HIBICLENS ) 4 % external liquid Apply topically daily as needed. 09/23/23   Stuart Vernell Norris, PA-C  pantoprazole  (PROTONIX ) 40 MG tablet Take 1 tablet (40 mg total) by mouth daily. 11/18/21 01/17/22  Cleotilde Rogue, MD  silver  sulfADIAZINE  (SILVADENE ) 1 % cream Apply 1 Application topically daily. 09/23/23   Stuart Vernell Norris, PA-C    Allergies: Patient has no known allergies.    Review of Systems  Eyes:  Positive for pain.  All other systems reviewed and are negative.   Updated Vital Signs BP 121/83   Pulse 91   Temp 98.8 F (37.1 C) (Oral)   Resp 18   Ht 1.689 m (5' 6.5)   Wt 54.4 kg   SpO2 98%   BMI 19.08 kg/m   Physical Exam Vitals and nursing note reviewed.  Constitutional:      Appearance: He is well-developed. He is not diaphoretic.  HENT:     Head: Normocephalic and atraumatic.  Eyes:      General:        Right eye: No discharge.        Left eye: No discharge.     Conjunctiva/sclera: Conjunctivae normal.     Comments: Bilateral conjunctive are totally normal, he has a left upper eyelid is swollen with what appears to be a fluctuant abscess just caudad to the left mid eyebrow.  He is able to fully open and close his eyes, the conjunctive are normal, pupils are normal, extraocular movements are normal.  He has soft nontender mobile cyst in the inferior lid and cheek on the left.  Pulmonary:     Effort: Pulmonary effort is normal. No respiratory distress.  Skin:    General: Skin is warm and dry.     Findings: No erythema or rash.  Neurological:     Mental Status: He is alert.     Coordination: Coordination normal.     (all labs ordered are listed, but only abnormal results are displayed) Labs Reviewed - No data to display  EKG: None  Radiology: No results found.   Procedures   Medications Ordered in the ED - No data to display  Medical Decision Making  Patient has what appears to be an abscess to the left upper eyelid, it is almost ready to drain, very fluctuant and superficial.  I recommended hot compresses, hot showers, doxycycline , follow-up outpatient.  Patient agreeable this cyst on the lower area into the left are unremarkable nontender and likely benign cysts     Final diagnoses:  Abscess of left upper eyelid  Dermoid cyst of head    ED Discharge Orders          Ordered    doxycycline  (VIBRAMYCIN ) 100 MG capsule  2 times daily        02/05/24 1453               Cleotilde Rogue, MD 02/05/24 1454

## 2024-02-05 NOTE — Discharge Instructions (Signed)
 I want you to use warm compresses and hot showers to help the area and your left eyelid drain.  You will need to take doxycycline  twice a day, if this continues to worsen or spread or get larger you may need to come back to have this opened up and cut open.  It does look like it is about to drain so I would prefer that this happened without cutting open your eyelid is that can be a very dangerous procedure.  I want you to see your family doctor for a follow-up, if you do not have a doctor see the list below  [facility] Primary Care Doctor List    Rollene Pesa, MD. Specialty: [facility] Medicine Contact information: [address]  [place] [address]  [phone]   Glendia Fielding, MD. Specialty: [facility] Medicine Contact information: [address]  [place] [address]  [phone]   Benita Outhouse, MD Specialty: Internal Medicine Contact information: [address] [place] [address]  [phone]   Darlyn Hurst, MD. Specialty: Internal Medicine Contact information: [address]  [place] [address]  [phone]    [facility] (Dr. Luke) Specialty: Family Medicine Contact information: [address]  [place] [address]  [phone]   Gaither Langton, MD. Specialty: Internal Medicine Contact information: [address]  [address]  [place] [address]  [phone]   [facility]  [address] [place], [place] [address] [phone]  Services [facility] offers a variety of basic health services.  People needing more complex services will be directed to a physician online. Using these virtual visits, doctors can evaluate and prescribe medicine and treatments. There will be no medication on-site, though [facility] will help patients fill their prescriptions at little to no cost.   [facility] [address] [place]  [place]  [address]  [phone]     [facility] [address] [place]   [address]  [phone]   For More information please go to: [email or web address]

## 2024-02-05 NOTE — ED Triage Notes (Signed)
 Pt with abscess above left eye x 1 month, states it's painful.  Pt has one under left eye as well for awhile but denies any pain.

## 2024-02-05 NOTE — ED Notes (Signed)
 Pt upset that the cyst below left eye was not lanced and removed in the emergency room. Pt informed where to find a doctor on his discharge papers.

## 2024-02-07 ENCOUNTER — Encounter: Payer: Self-pay | Admitting: Emergency Medicine

## 2024-02-07 ENCOUNTER — Ambulatory Visit: Admission: EM | Admit: 2024-02-07 | Discharge: 2024-02-07 | Disposition: A | Discharge status: Home / Self Care

## 2024-02-07 DIAGNOSIS — H00034 Abscess of left upper eyelid: Secondary | ICD-10-CM

## 2024-02-07 DIAGNOSIS — L720 Epidermal cyst: Secondary | ICD-10-CM | POA: Diagnosis not present

## 2024-02-07 NOTE — ED Triage Notes (Signed)
 Swelling around left eye x 6 months.  Now has a swollen area above eye that started to drain this morning.  States has not been taking doxycycline  because he can't afford it.  States was seen in the ED for same

## 2024-02-07 NOTE — Discharge Instructions (Addendum)
 Please go to the [facility] on [address] and pick up your prescription from 02/05/24 and start taking it to treat the infection above your eye.  Continue warm compresses.    Also recommend follow up with a Plastic Surgeon if you want to have the other spots on your face removed.  Contact information has been provided.

## 2024-02-07 NOTE — ED Provider Notes (Signed)
 [facility]    CSN: [id number] Arrival date & time: 02/07/24  1407      History   Chief Complaint No chief complaint on file.   HPI Erik Burns is a 58 y.o. male.   Patient presents today with left upper eyelid abscess that is draining.  Reports area is painful and tender.  He has been applying warm compresses and today in the shower used the showerhead and hot water directly to the eye.  Reports when he left the shower, he noticed the eye was draining and it feels much better.  No fevers or nausea/vomiting.  He was seen in the emergency room 2 days ago for same symptoms and has not picked up antibiotic because he states he cannot afford it.  Patient is also concerned about cyst to the left side of his face that have been present for years.  They are not changing or getting bigger.  They are bothersome to him cosmetically.    History reviewed. No pertinent past medical history.  There are no active problems to display for this patient.   History reviewed. No pertinent surgical history.     Home Medications    Prior to Admission medications   Medication Sig Start Date End Date Taking? Authorizing Provider  chlorhexidine  (HIBICLENS ) 4 % external liquid Apply topically daily as needed. 09/23/23   Stuart Vernell Norris, PA-C  doxycycline  (VIBRAMYCIN ) 100 MG capsule Take 1 capsule (100 mg total) by mouth 2 (two) times daily. 02/05/24   Cleotilde Rogue, MD  pantoprazole  (PROTONIX ) 40 MG tablet Take 1 tablet (40 mg total) by mouth daily. 11/18/21 01/17/22  Cleotilde Rogue, MD  silver  sulfADIAZINE  (SILVADENE ) 1 % cream Apply 1 Application topically daily. 09/23/23   Stuart Vernell Norris, PA-C    Family History History reviewed. No pertinent family history.  Social History Social History   Tobacco Use   Smoking status: Every Day    Current packs/day: 1.00    Average packs/day: 1 pack/day for 28.0 years (28.0 ttl pk-yrs)    Types: Cigarettes   Smokeless tobacco:  Never  Vaping Use   Vaping status: Never Used  Substance Use Topics   Alcohol use: Yes    Alcohol/week: 2.0 standard drinks of alcohol    Types: 2 Cans of beer per week    Comment: beer   Drug use: Yes    Types: Marijuana     Allergies   Patient has no known allergies.   Review of Systems Review of Systems Per HPI  Physical Exam Triage Vital Signs ED Triage Vitals  Encounter Vitals Group     BP 02/07/24 1429 (!) 143/88     Girls Systolic BP Percentile --      Girls Diastolic BP Percentile --      Boys Systolic BP Percentile --      Boys Diastolic BP Percentile --      Pulse Rate 02/07/24 1429 70     Resp 02/07/24 1429 18     Temp 02/07/24 1429 97.7 F (36.5 C)     Temp Source 02/07/24 1429 Oral     SpO2 02/07/24 1429 96 %     Weight --      Height --      Head Circumference --      Peak Flow --      Pain Score 02/07/24 1430 7     Pain Loc --      Pain Education --  Exclude from Growth Chart --    No data found.  Updated Vital Signs BP (!) 143/88 (BP Location: Right Arm)   Pulse 70   Temp 97.7 F (36.5 C) (Oral)   Resp 18   SpO2 96%   Visual Acuity Right Eye Distance:   Left Eye Distance:   Bilateral Distance:    Right Eye Near:   Left Eye Near:    Bilateral Near:     Physical Exam Vitals and nursing note reviewed.  Constitutional:      General: He is not in acute distress.    Appearance: Normal appearance. He is not toxic-appearing.  HENT:     Head: Normocephalic and atraumatic.     Mouth/Throat:     Mouth: Mucous membranes are moist.  Pulmonary:     Effort: Pulmonary effort is normal. No respiratory distress.  Skin:    General: Skin is warm and dry.     Capillary Refill: Capillary refill takes less than 2 seconds.     Coloration: Skin is not jaundiced or pale.     Findings: Abscess present. No rash.     Comments: Draining abscess left upper eyelid.  Area is not fluctuant but is erythematous.  Multiple cysts noted left face  laterally.  No pain, induration, fluctuance.  Neurological:     Mental Status: He is alert and oriented to person, place, and time.  Psychiatric:        Behavior: Behavior is cooperative.      UC Treatments / Results  Labs (all labs ordered are listed, but only abnormal results are displayed) Labs Reviewed - No data to display  EKG   Radiology No results found.  Procedures Procedures (including critical care time)  Medications Ordered in UC Medications - No data to display  Initial Impression / Assessment and Plan / UC Course  I have reviewed the triage vital signs and the nursing notes.  Pertinent labs & imaging results that were available during my care of the patient were reviewed by me and considered in my medical decision making (see chart for details).  Vital signs are stable and patient is well-appearing.  Discussed with patient that his insurance will likely cover entire cost of doxycycline  and he should pick up prescription from ER visit 02/05/2024 and take as prescribed.  Continue warm compresses in the meantime.  Also with concern regarding cysts to the left side of his face, recommended follow-up with plastic surgery and contact formation provided today.  Strict ER return precautions were discussed with patient if his symptoms worsen.  The patient was given the opportunity to ask questions.  All questions answered to their satisfaction.  The patient is in agreement to this plan.   Final Clinical Impressions(s) / UC Diagnoses   Final diagnoses:  Abscess of left upper eyelid  Epidermal cyst of face     Discharge Instructions      Please go to the [facility] on [address] and pick up your prescription from 02/05/24 and start taking it to treat the infection above your eye.  Continue warm compresses.    Also recommend follow up with a Plastic Surgeon if you want to have the other spots on your face removed.  Contact information has been provided.     ED  Prescriptions   None    PDMP not reviewed this encounter.   Chandra Harlene LABOR, NP 02/07/24 [phone]
